# Patient Record
Sex: Female | Born: 1960 | Race: White | Hispanic: No | Marital: Married | Smoking: Former smoker
Health system: Southern US, Community
[De-identification: ages and names within clinical notes are randomized; demographics above are authoritative.]

## PROBLEM LIST (undated history)

## (undated) DIAGNOSIS — M8580 Other specified disorders of bone density and structure, unspecified site: Secondary | ICD-10-CM

## (undated) DIAGNOSIS — K589 Irritable bowel syndrome without diarrhea: Secondary | ICD-10-CM

## (undated) DIAGNOSIS — E042 Nontoxic multinodular goiter: Secondary | ICD-10-CM

## (undated) DIAGNOSIS — M1990 Unspecified osteoarthritis, unspecified site: Secondary | ICD-10-CM

## (undated) DIAGNOSIS — E785 Hyperlipidemia, unspecified: Secondary | ICD-10-CM

## (undated) HISTORY — DX: Nontoxic multinodular goiter: E04.2

## (undated) HISTORY — PX: CHOLECYSTECTOMY: SHX55

## (undated) HISTORY — DX: Hyperlipidemia, unspecified: E78.5

## (undated) HISTORY — DX: Unspecified osteoarthritis, unspecified site: M19.90

## (undated) HISTORY — DX: Other specified disorders of bone density and structure, unspecified site: M85.80

## (undated) HISTORY — DX: Irritable bowel syndrome, unspecified: K58.9

---

## 1998-03-24 ENCOUNTER — Other Ambulatory Visit: Admission: RE | Admit: 1998-03-24 | Discharge: 1998-03-24 | Payer: Self-pay | Admitting: Obstetrics and Gynecology

## 1998-12-11 ENCOUNTER — Ambulatory Visit (HOSPITAL_COMMUNITY): Admission: RE | Admit: 1998-12-11 | Discharge: 1998-12-11 | Payer: Self-pay | Admitting: Obstetrics and Gynecology

## 1999-08-10 ENCOUNTER — Other Ambulatory Visit: Admission: RE | Admit: 1999-08-10 | Discharge: 1999-08-10 | Payer: Self-pay | Admitting: Gynecology

## 1999-08-21 ENCOUNTER — Ambulatory Visit (HOSPITAL_COMMUNITY): Admission: RE | Admit: 1999-08-21 | Discharge: 1999-08-21 | Payer: Self-pay | Admitting: Gynecology

## 1999-08-21 ENCOUNTER — Encounter (INDEPENDENT_AMBULATORY_CARE_PROVIDER_SITE_OTHER): Payer: Self-pay | Admitting: Specialist

## 1999-12-14 ENCOUNTER — Ambulatory Visit (HOSPITAL_COMMUNITY): Admission: RE | Admit: 1999-12-14 | Discharge: 1999-12-14 | Payer: Self-pay | Admitting: Gynecology

## 1999-12-14 ENCOUNTER — Encounter: Payer: Self-pay | Admitting: Gynecology

## 2000-08-27 ENCOUNTER — Other Ambulatory Visit: Admission: RE | Admit: 2000-08-27 | Discharge: 2000-08-27 | Payer: Self-pay | Admitting: *Deleted

## 2001-01-21 ENCOUNTER — Encounter: Admission: RE | Admit: 2001-01-21 | Discharge: 2001-04-21 | Payer: Self-pay | Admitting: Gynecology

## 2001-03-22 ENCOUNTER — Inpatient Hospital Stay (HOSPITAL_COMMUNITY): Admission: AD | Admit: 2001-03-22 | Discharge: 2001-03-22 | Payer: Self-pay | Admitting: *Deleted

## 2001-03-25 ENCOUNTER — Inpatient Hospital Stay (HOSPITAL_COMMUNITY): Admission: AD | Admit: 2001-03-25 | Discharge: 2001-03-28 | Payer: Self-pay | Admitting: Gynecology

## 2001-03-25 ENCOUNTER — Encounter (INDEPENDENT_AMBULATORY_CARE_PROVIDER_SITE_OTHER): Payer: Self-pay | Admitting: Specialist

## 2001-03-30 ENCOUNTER — Encounter: Admission: RE | Admit: 2001-03-30 | Discharge: 2001-04-29 | Payer: Self-pay | Admitting: Gynecology

## 2001-05-06 ENCOUNTER — Other Ambulatory Visit: Admission: RE | Admit: 2001-05-06 | Discharge: 2001-05-06 | Payer: Self-pay | Admitting: *Deleted

## 2002-08-13 ENCOUNTER — Ambulatory Visit (HOSPITAL_COMMUNITY): Admission: RE | Admit: 2002-08-13 | Discharge: 2002-08-13 | Payer: Self-pay | Admitting: Gynecology

## 2002-08-13 ENCOUNTER — Other Ambulatory Visit: Admission: RE | Admit: 2002-08-13 | Discharge: 2002-08-13 | Payer: Self-pay | Admitting: Gynecology

## 2002-08-13 ENCOUNTER — Encounter: Payer: Self-pay | Admitting: Gynecology

## 2003-12-01 ENCOUNTER — Other Ambulatory Visit: Admission: RE | Admit: 2003-12-01 | Discharge: 2003-12-01 | Payer: Self-pay | Admitting: Gynecology

## 2003-12-02 ENCOUNTER — Ambulatory Visit (HOSPITAL_COMMUNITY): Admission: RE | Admit: 2003-12-02 | Discharge: 2003-12-02 | Payer: Self-pay | Admitting: Gynecology

## 2004-06-05 ENCOUNTER — Other Ambulatory Visit: Admission: RE | Admit: 2004-06-05 | Discharge: 2004-06-05 | Payer: Self-pay | Admitting: Gynecology

## 2004-09-17 ENCOUNTER — Ambulatory Visit: Payer: Self-pay | Admitting: Internal Medicine

## 2004-10-12 ENCOUNTER — Ambulatory Visit: Payer: Self-pay | Admitting: Internal Medicine

## 2004-10-19 ENCOUNTER — Ambulatory Visit: Payer: Self-pay | Admitting: Internal Medicine

## 2005-01-25 ENCOUNTER — Ambulatory Visit (HOSPITAL_COMMUNITY): Admission: RE | Admit: 2005-01-25 | Discharge: 2005-01-25 | Payer: Self-pay | Admitting: Gynecology

## 2005-06-26 ENCOUNTER — Other Ambulatory Visit: Admission: RE | Admit: 2005-06-26 | Discharge: 2005-06-26 | Payer: Self-pay | Admitting: Gynecology

## 2005-08-01 ENCOUNTER — Ambulatory Visit: Payer: Self-pay | Admitting: Internal Medicine

## 2005-08-27 ENCOUNTER — Ambulatory Visit: Payer: Self-pay | Admitting: Endocrinology

## 2005-09-10 ENCOUNTER — Ambulatory Visit: Payer: Self-pay | Admitting: Internal Medicine

## 2005-09-24 ENCOUNTER — Encounter: Admission: RE | Admit: 2005-09-24 | Discharge: 2005-09-24 | Payer: Self-pay | Admitting: Family Medicine

## 2006-03-18 ENCOUNTER — Ambulatory Visit (HOSPITAL_COMMUNITY): Admission: RE | Admit: 2006-03-18 | Discharge: 2006-03-18 | Payer: Self-pay | Admitting: Gynecology

## 2006-08-15 ENCOUNTER — Other Ambulatory Visit: Admission: RE | Admit: 2006-08-15 | Discharge: 2006-08-15 | Payer: Self-pay | Admitting: Gynecology

## 2007-09-25 ENCOUNTER — Other Ambulatory Visit: Admission: RE | Admit: 2007-09-25 | Discharge: 2007-09-25 | Payer: Self-pay | Admitting: Gynecology

## 2007-10-30 ENCOUNTER — Encounter: Admission: RE | Admit: 2007-10-30 | Discharge: 2007-10-30 | Payer: Self-pay | Admitting: Otolaryngology

## 2007-11-25 ENCOUNTER — Ambulatory Visit (HOSPITAL_COMMUNITY): Admission: RE | Admit: 2007-11-25 | Discharge: 2007-11-28 | Payer: Self-pay | Admitting: General Surgery

## 2007-11-25 ENCOUNTER — Encounter (INDEPENDENT_AMBULATORY_CARE_PROVIDER_SITE_OTHER): Payer: Self-pay | Admitting: General Surgery

## 2007-11-30 ENCOUNTER — Emergency Department (HOSPITAL_COMMUNITY): Admission: EM | Admit: 2007-11-30 | Discharge: 2007-12-01 | Payer: Self-pay | Admitting: Emergency Medicine

## 2007-12-02 ENCOUNTER — Inpatient Hospital Stay (HOSPITAL_COMMUNITY): Admission: EM | Admit: 2007-12-02 | Discharge: 2007-12-05 | Payer: Self-pay | Admitting: Emergency Medicine

## 2008-02-12 ENCOUNTER — Ambulatory Visit (HOSPITAL_COMMUNITY): Admission: RE | Admit: 2008-02-12 | Discharge: 2008-02-12 | Payer: Self-pay | Admitting: Gynecology

## 2008-12-23 ENCOUNTER — Other Ambulatory Visit: Admission: RE | Admit: 2008-12-23 | Discharge: 2008-12-23 | Payer: Self-pay | Admitting: Gynecology

## 2008-12-23 ENCOUNTER — Encounter: Payer: Self-pay | Admitting: Gynecology

## 2008-12-23 ENCOUNTER — Ambulatory Visit: Payer: Self-pay | Admitting: Gynecology

## 2009-01-26 IMAGING — CR DG ABDOMEN ACUTE W/ 1V CHEST
3 series · 3 of 3 positions shown · non-contrast
Comparison: Chest of 09/24/05.

CLINICAL DATA: Mid chest and back pain.  Cholecystectomy one week ago.
 ACUTE ABDOMINAL SERIES WITH CHEST ? 3 VIEW:

[w chest pa]
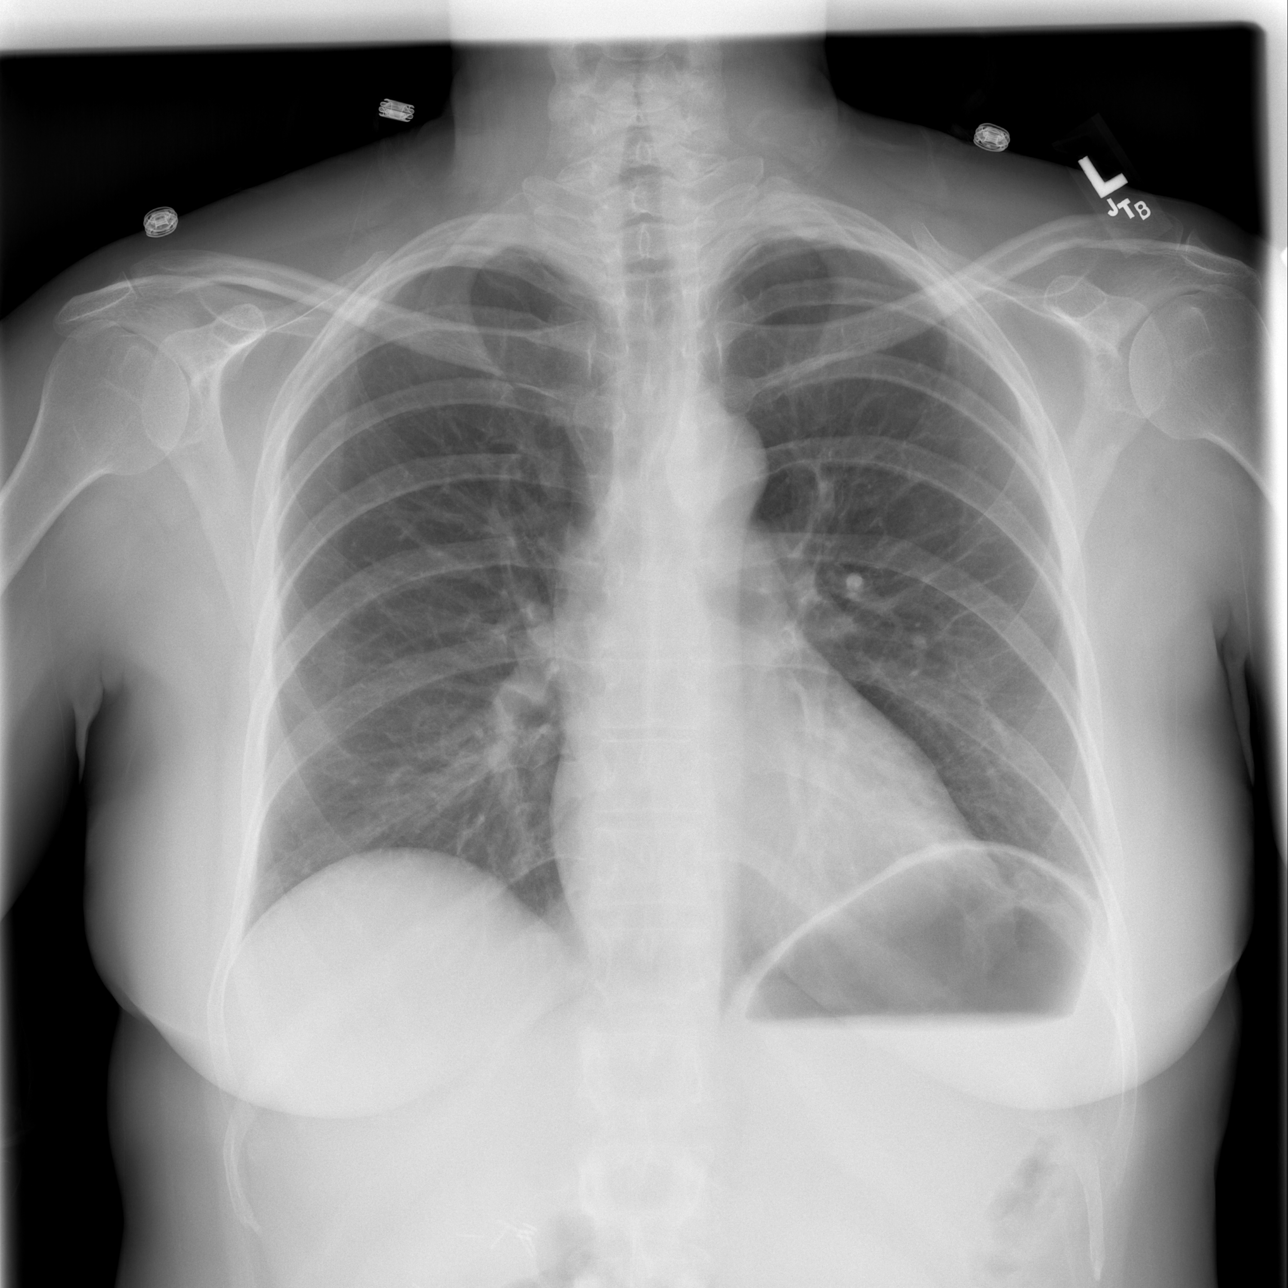

[w abdomen upright]
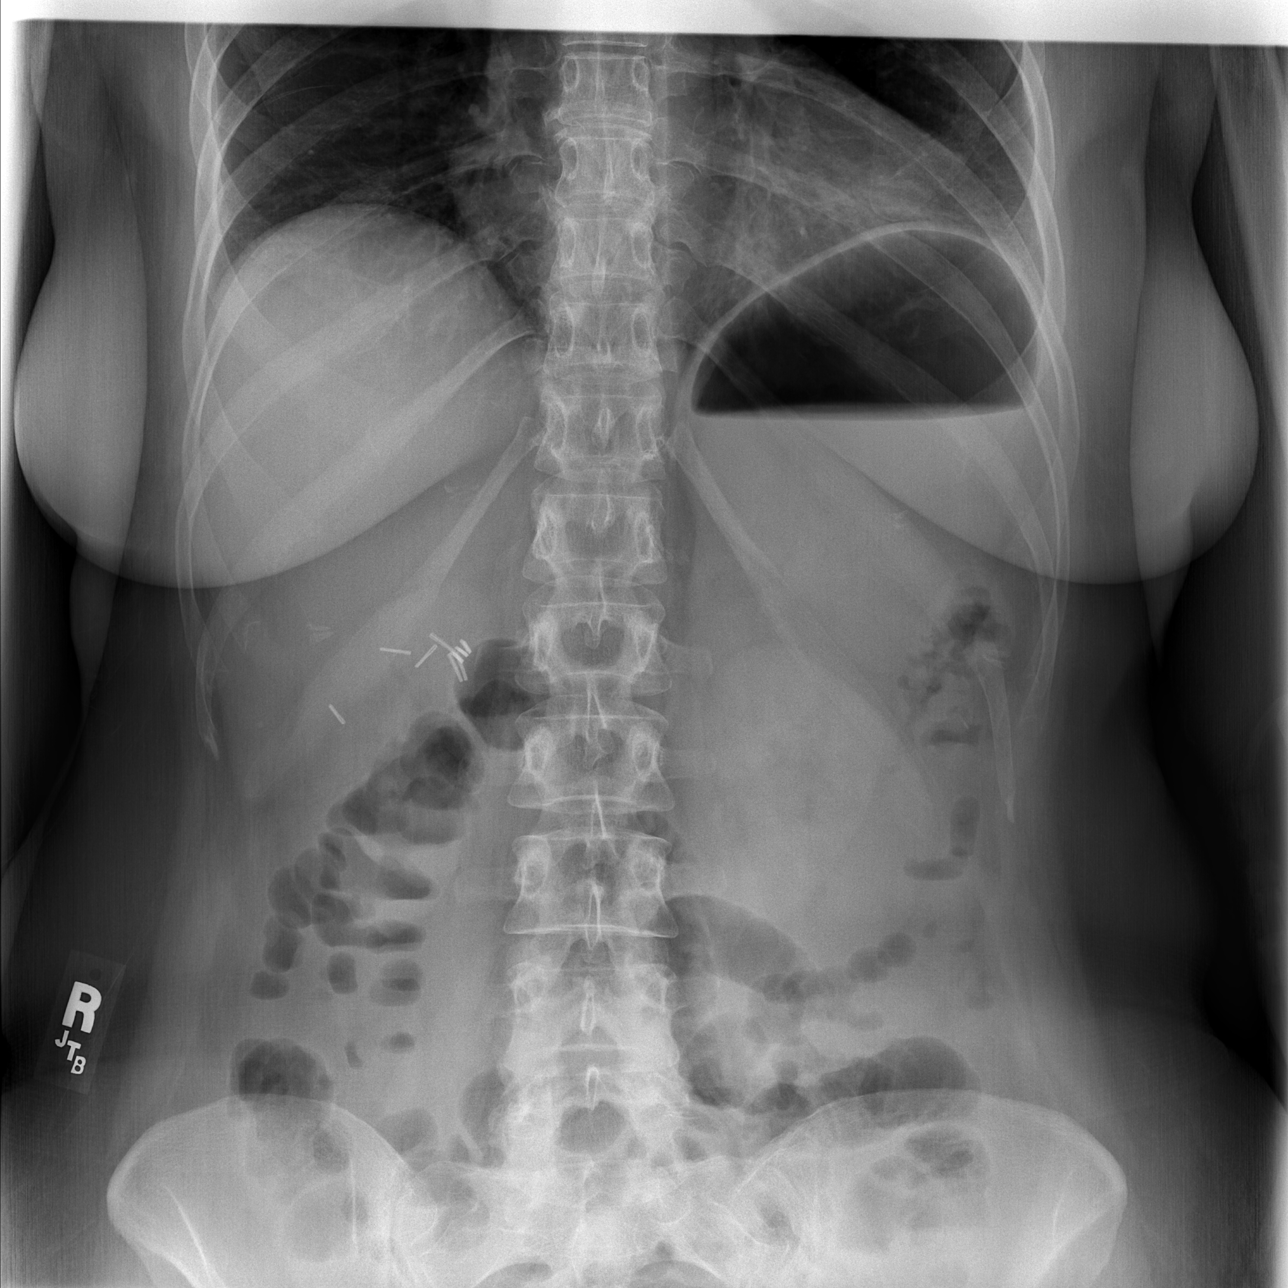

[t abdomen supine]
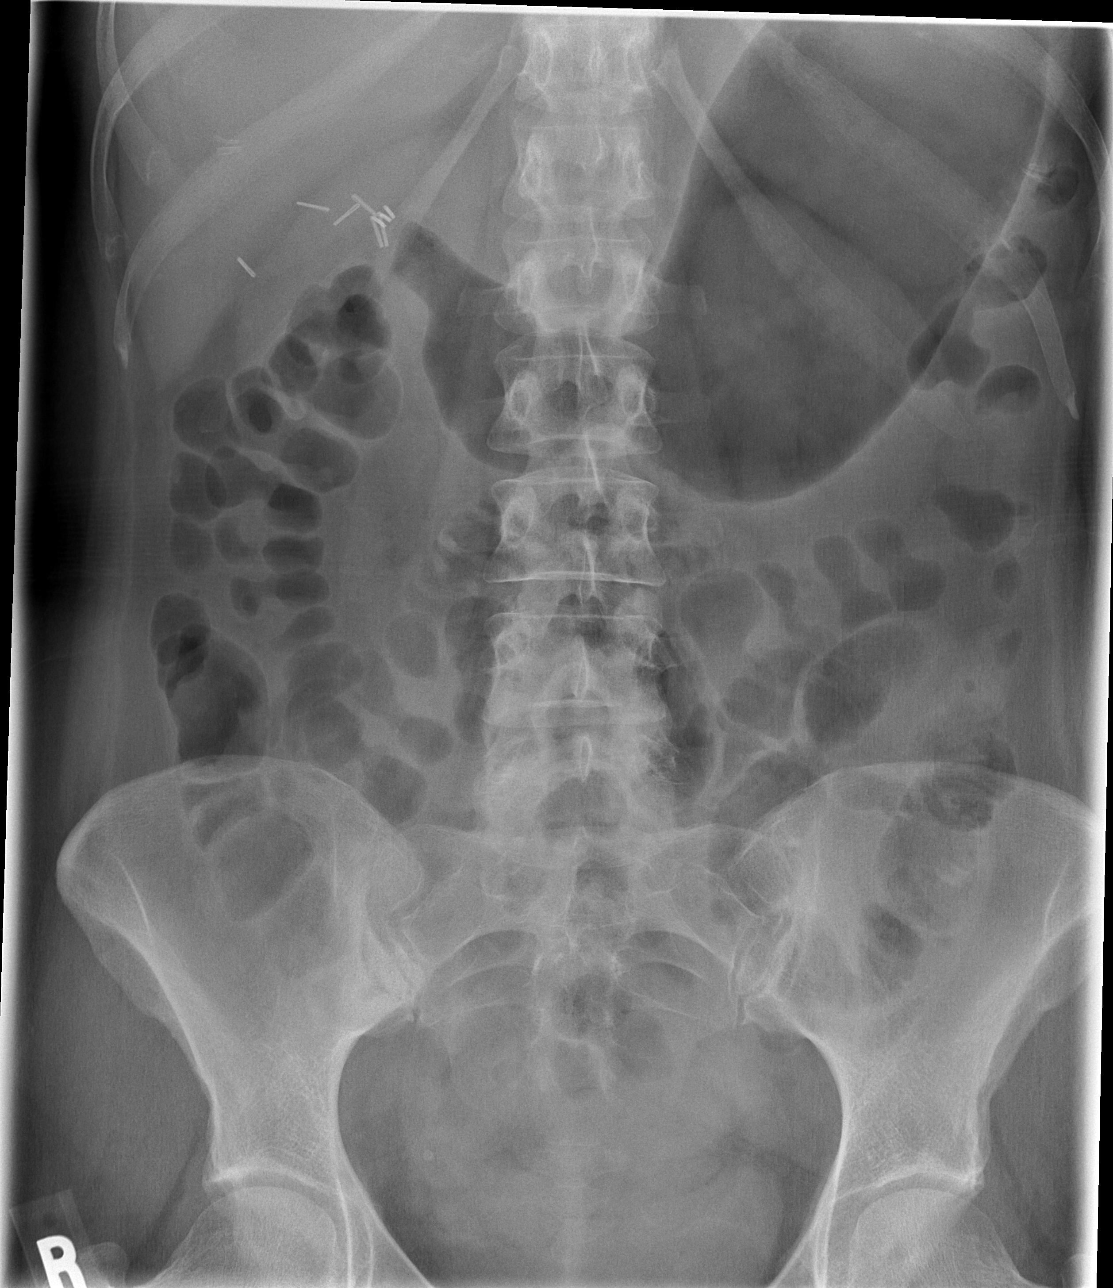

[3 of 3 positions shown; findings below may reference images not displayed]

FINDINGS: A single view of the chest shows the lungs to be clear.  The heart is within normal limits in size.  
 Supine and erect views of the abdomen show no bowel obstruction.  Slight gaseous distention of the small bowel is seen, but no obstruction is noted.  No free air is evident.  Surgical clips are present in the right upper quadrant.
IMPRESSION: 1. No active lung disease. 
 2. No obstruction or free air.  Minimal gaseous distention of a few loops of small bowel.  Consider followup if warranted clinically.

## 2009-01-27 ENCOUNTER — Ambulatory Visit: Payer: Self-pay | Admitting: Gynecology

## 2009-02-03 ENCOUNTER — Ambulatory Visit: Payer: Self-pay | Admitting: Gynecology

## 2009-03-03 ENCOUNTER — Ambulatory Visit (HOSPITAL_COMMUNITY): Admission: RE | Admit: 2009-03-03 | Discharge: 2009-03-03 | Payer: Self-pay | Admitting: Gynecology

## 2009-07-14 ENCOUNTER — Ambulatory Visit: Payer: Self-pay | Admitting: Gynecology

## 2009-07-28 ENCOUNTER — Ambulatory Visit: Payer: Self-pay | Admitting: Gynecology

## 2009-08-04 ENCOUNTER — Ambulatory Visit: Payer: Self-pay | Admitting: Gynecology

## 2010-05-18 ENCOUNTER — Encounter: Admission: RE | Admit: 2010-05-18 | Discharge: 2010-05-18 | Payer: Self-pay | Admitting: Family Medicine

## 2010-09-07 ENCOUNTER — Ambulatory Visit (HOSPITAL_COMMUNITY)
Admission: RE | Admit: 2010-09-07 | Discharge: 2010-09-07 | Payer: Self-pay | Source: Home / Self Care | Admitting: Gynecology

## 2010-11-16 ENCOUNTER — Other Ambulatory Visit: Payer: Self-pay | Admitting: Gynecology

## 2010-11-16 ENCOUNTER — Other Ambulatory Visit (HOSPITAL_COMMUNITY): Admission: RE | Admit: 2010-11-16 | Payer: BC Managed Care – HMO | Source: Ambulatory Visit | Admitting: Gynecology

## 2010-11-16 ENCOUNTER — Ambulatory Visit
Admission: RE | Admit: 2010-11-16 | Discharge: 2010-11-16 | Payer: Self-pay | Source: Home / Self Care | Attending: Gynecology | Admitting: Gynecology

## 2010-11-16 ENCOUNTER — Other Ambulatory Visit (HOSPITAL_COMMUNITY)
Admission: RE | Admit: 2010-11-16 | Discharge: 2010-11-16 | Disposition: A | Discharge status: Home / Self Care | Payer: BC Managed Care – HMO | Source: Ambulatory Visit | Attending: Gynecology | Admitting: Gynecology

## 2010-11-16 DIAGNOSIS — Z124 Encounter for screening for malignant neoplasm of cervix: Secondary | ICD-10-CM | POA: Insufficient documentation

## 2010-11-23 ENCOUNTER — Other Ambulatory Visit: Payer: BC Managed Care – HMO

## 2010-11-23 DIAGNOSIS — E7800 Pure hypercholesterolemia, unspecified: Secondary | ICD-10-CM

## 2010-11-26 ENCOUNTER — Other Ambulatory Visit: Payer: Self-pay

## 2010-12-14 ENCOUNTER — Ambulatory Visit (INDEPENDENT_AMBULATORY_CARE_PROVIDER_SITE_OTHER): Payer: BC Managed Care – HMO | Admitting: Gynecology

## 2010-12-14 ENCOUNTER — Other Ambulatory Visit: Payer: BC Managed Care – HMO

## 2010-12-14 DIAGNOSIS — N83339 Acquired atrophy of ovary and fallopian tube, unspecified side: Secondary | ICD-10-CM

## 2010-12-14 DIAGNOSIS — N949 Unspecified condition associated with female genital organs and menstrual cycle: Secondary | ICD-10-CM

## 2011-03-05 NOTE — Consult Note (Signed)
[name], [name]            ACCOUNT NO.:  [id number]   MEDICAL RECORD NO.:  [id number]          PATIENT TYPE:  OIB   LOCATION:  0098                         FACILITY:  [facility]   PHYSICIAN:  James L. Randa Evens, M.D. DATE OF BIRTH:  03-Dec-1960   DATE OF CONSULTATION:  DATE OF DISCHARGE:                                 CONSULTATION   We were asked to see [name] today in consultation for common bile  duct stone by Dr. Kendrick Ranch.   HISTORY OF PRESENT ILLNESS:  This is a very pleasant 50 year old female  with a very short past medical history, who saw her primary care  physician and had a renal ultrasound done recently as her father and  brother had both passed with renal cell carcinoma.  The ultrasound  revealed a stone-filled gallbladder and an appointment was scheduled  with Dr. Earlene Plater for a cholecystectomy.  Between the time that the  appointment was scheduled and the time that she was seen, the patient  became symptomatic with pretty severe chest pain between her breasts  that radiated to her back.  She claims it felt like a heart attack.  Her  cholecystectomy was done today.  She appears to have no complications.  However, on intraoperative cholangiogram there were common bile duct  stones found.  There was no obstruction.   PAST MEDICAL HISTORY:  Significant for a C-section in 2002 as well as a  thyroid goiter, but she has a normal-functioning thyroid.   FAMILY HISTORY:  Significant for renal cell carcinoma and her brother,  who has colon polyps.   SOCIAL HISTORY:  Negative for tobacco.  Negative for alcohol.  The  patient is married.  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   PHYSICAL EXAM:  She is currently in OR recovery waking up.  HEART:  Regular rate and rhythm with no murmurs, rubs or gallops  appreciated.  LUNGS:  Clear to auscultation bilaterally.  ABDOMEN:  Relatively nontender, however slightly tender to palpation in  the upper quadrants.  Her  bandage is clean and dry.  Her bowel sounds  are currently quiet, as is expected.   Labs done on January 30 showed a hemoglobin of 12.4, hematocrit 35.5.  BMET was within normal limits.  LFTs showed an AST of 23, ALT 16,  alkaline phosphatase 64, total bilirubin 0.9.  Her radiological exam  done immediately postop showed choledocholithiasis with several filling  defects in the CBD including a possible filling defect at the level of  the common hepatic duct.  She had no common bile duct obstruction.  There was no contrast extravasation and she had multiple gallstones  seen.   ASSESSMENT:  Dr. Carman Ching has seen and examined the patient.  His  impression is that this is a 50 year old female with  choledocholithiasis.   Plan for ERCP on February 5 at 1:15 with Dr. Wandalee Ferdinand.  The risks of  perforation, sedation, pancreatitis and possibly death were discussed  with both the patient and her husband, who understand these risks and  wish to proceed with the ERCP.  Thanks very much for this consultation.      Stephani Police, PA    ______________________________  Llana Aliment Randa Evens, M.D.    MLY/MEDQ  D:  11/25/2007  T:  11/26/2007  Job:  [id number]   cc:   Sheppard Plumber. Earlene Plater, M.D.  [address]  [place]  [place] [address]   Graylin Shiver, M.D.  Fax: [phone]   Jethro Bastos, M.D.  Fax: [phone]

## 2011-03-05 NOTE — Op Note (Signed)
[name], [name]            ACCOUNT NO.:  [id number]   MEDICAL RECORD NO.:  [id number]          PATIENT TYPE:  AMB   LOCATION:  DAY                          FACILITY:  [facility]   PHYSICIAN:  Timothy E. Earlene Plater, M.D. DATE OF BIRTH:  [birth year]   DATE OF PROCEDURE:  11/25/2007  DATE OF DISCHARGE:                               OPERATIVE REPORT   PREOPERATIVE DIAGNOSIS:  Cholecystolithiasis.   POSTOPERATIVE DIAGNOSIS:  Choledochocholecystolithiasis.   OPERATIVE PROCEDURE:  Laparoscopic cholecystectomy and operative  cholangiogram.   SURGEON:  Kendrick Ranch, M.D.   ASSISTANT:  Anselm Pancoast. Zachery Dakins, M.D.   ANESTHESIA:  General.   Ms. Terris has been seen in the office.  She was thought to have  asymptomatic gallstones until she had her first major obvious  gallbladder attack about two weeks ago.  Her discomfort has persisted,  and she was scheduled urgently.  Today, her laboratory data are normal.  Although she has had nothing but full liquids for the last 5 days.  She  was seen, identified, and the permit signed.   She was taken to operating room, placed supine.  General endotracheal  anesthesia administered.  The abdomen was prepped and draped in the  usual fashion.  Quarter percent Marcaine was used throughout for local  anesthesia.  A vertical infraumbilical incision made, the fascia  identified and opened, the peritoneum entered without complications.  The [facility] catheter placed, tied in place with a #1 Vicryl.  A second 10-  mm trocar was placed in the midepigastrium and two 5-mm trocars in the  right upper quadrant.  There were adhesions of the right colon to the  far lateral right abdominal wall, and there were dense and filmy  adhesions of the omentum to the gallbladder.  The gallbladder was  grasped, placed on tension, the adhesions taken down sharply and  bluntly, cautery used.  Once this was accomplished, irrigation was done  and was clear.  There was no evidence  bleeding.  The gallbladder was  displayed then in its length.  The base of the gallbladder was dissected  completely.  Cystic artery and the cystic duct were identified.  The  artery crossed over the duct, so I triply clipped and divided it.  The  clip was placed on the gallbladder side of the cystic duct.  The cystic  duct was then opened, and a percutaneously passed catheter was  introduced into the cystic duct stump.  A clip was applied, real-time  cholangiography carried out, and on three different runs, stones were  seen to float both in the common bile duct and the common hepatic duct.  So, these were not reachable via the small cystic duct, so the catheter  and clip were removed.  The cystic duct stump triply clipped and fully  divided.  The gallbladder was then dissected from the base of the liver  without complications.  The base was inspected.  It was dry.  All clips  were intact.  Gallbladder bladder was placed in an EndoCatch bag and  removed through the infraumbilical incision which was then closed and an  additional suture  placed.  This was all done under direct vision.  Further copious irrigation carried out.  Then, all irrigant, CO2,  instruments, and trocars removed under  direct vision, and all counts were correct.  Each wound was inspected  and closed with Monocryl.  Steri-Strips and dry sterile dressing  applied.  Final counts correct.  The patient was moved to recovery room  where she will admitted for extended stay, and gastroenterology will be  consulted.      Timothy E. Earlene Plater, M.D.  Electronically Signed     TED/MEDQ  D:  11/25/2007  T:  11/26/2007  Job:  [id number]   cc:   Jethro Bastos, M.D.  Fax: [phone]

## 2011-03-05 NOTE — Op Note (Signed)
[name], [name]            ACCOUNT NO.:  [id number]   MEDICAL RECORD NO.:  [id number]          PATIENT TYPE:  OIB   LOCATION:  1404                         FACILITY:  [facility]   PHYSICIAN:  John C. Madilyn Fireman, M.D.    DATE OF BIRTH:  06/21/1961   DATE OF PROCEDURE:  11/26/2007  DATE OF DISCHARGE:                               OPERATIVE REPORT   PROCEDURE:  Endoscopic retrograde cholangiopancreatography.   INDICATIONS FOR PROCEDURE:  Possible common bile duct stone versus air  bubbles seen on intraoperative cholangiogram at the time of ERCP  yesterday.   PROCEDURE:  The patient was placed in the prone position and placed on  the pulse monitor with continuous low-flow oxygen delivered by nasal  cannula.  She was sedated with 137.5 mcg IV fentanyl and 15 mg IV  Versed.  The Olympus side-viewing endoscope was advanced blindly into  the oropharynx, esophagus and stomach.  The pylorus was traversed and  the papilla of Vater located on the medial duodenal wall.  It had a  normal appearance but was somewhat protruding and was a little bit  difficult to approach head-on.  Approaches from an angle tended to push  the papillary structure to one side making cannulation difficult.  We  were able to obtain shallow cannulation and on one injection opacified  the pancreatic duct which appeared normal.  On repositioning, we were  able to opacify the common bile duct and with filling of intrahepatic  radicles and we did not see any filling defects.  However, I was not  able to obtain deep cannulation with either of size guidewires or with  the catheter.  During these attempts there was a fair amount of edema of  the papillary structure and we ceased to see the dye exiting it.  Also  we noted that the bile did not drain well from the CBD although it did  drain completely from the pancreatic duct.  With careful inspection not  revealing any intraluminal filling defects, I decided to forego further  attempts at deep cannulation at this point and the scope was then  withdrawn and the patient returned to the recovery room in stable  condition.  She tolerated the procedure well.  There were no immediate  complications.   IMPRESSION:  Normal-appearing common bile duct and pancreatic duct with  no visible intraluminal filling defects.  I suspect from review of  previous cholangiograms as well as nondilated duct and normal liver  function tests that defects seen yesterday may have been air bubbles  introduced during the procedure.   PLAN:  Expectant management alone for now.  Will monitor overnight for  possibility of procedure related pancreatitis and if does well  overnight, advance diet and expect early discharge.           ______________________________  Everardo All. Madilyn Fireman, M.D.     JCH/MEDQ  D:  11/26/2007  T:  11/27/2007  Job:  [id number]

## 2011-03-05 NOTE — H&P (Signed)
[name], [name] NO.:  [id number]   MEDICAL RECORD NO.:  [id number]          PATIENT TYPE:  INP   LOCATION:  5127                         FACILITY:  [facility]   PHYSICIAN:  John C. Madilyn Barnes, M.D.    DATE OF BIRTH:  Jul 31, 1961   DATE OF ADMISSION:  12/02/2007  DATE OF DISCHARGE:                              HISTORY & PHYSICAL   CHIEF COMPLAINT:  Abdominal and chest pain with vomiting.   HISTORY OF PRESENT ILLNESS:  The patient is a 50 year old white female  who underwent laparoscopic cholecystectomy on November 25, 2007 by Dr.  Lorelee New.  She had recently been diagnosed as having gallstones  during a renal ultrasound and then had an attack of abdominal pain,  nausea and vomiting consistent with biliary colic.  An intraoperative  cholangiogram was obtained and was felt to show either small stones or  air bubbles.  Her liver function tests were normal and her common bile  duct was normal in caliber.  However due to the filling defects, an ERCP  was done to assess for stones and no stones were seen.  However, there  was some edema to the patella related to the procedure and the bile duct  could not drain well.  Also I was unable to achieve free decannulation  of the common bile duct.  The following day,  there was a rise in her  liver function tests with transaminases in the 200 range.  She was doing  fairly well at that time, but it was decided to keep her in the hospital  another night.  Her liver function tests decreased slightly and she went  home on November 28, 2007.  Later that evening, she was experiencing some  mild epigastric chest discomfort and general malaise.  Since then, she  has had variable degrees of malaise, achiness, headaches and more  recently some resumption of some chest and back pain.  She went to the  emergency room on September 29, 2007 and her liver function tests were  nearly down to normal.  Amylase and lipase continued to be normal.   She  had a normal white blood cell count at that time.  She returned to see  Dr. Dorothe Pea yesterday and reportedly had some blood work that was  unremarkable according to the patient.  This morning, her husband called  stating she had been having increasing chest and back pain last night  and then was having nausea and vomiting this morning.  I had her come to  the emergency room.  Her  AST was 102 and ALT was 136, up somewhat from  values of 33 and 121 two days ago. She had a HIDA scan which was  reportedly negative for bile leak.  She is still having chest and  abdominal pain, nausea and she is admitted for further workup.   PAST MEDICAL HISTORY:  Thyroid goiter.   SURGERIES:  D&C.   ALLERGIES:  NONE.   MEDICATIONS:  None.   SOCIAL HISTORY:  The patient is married.  She has 1 daughter who works  as a Sales executive.  FAMILY HISTORY:  Positive for renal cell carcinoma and also colon polyps  in a brother.   PHYSICAL EXAMINATION:  GENERAL:  Well-developed, well-nourished white  female in no acute stress.  HEART:  Regular rhythm without murmur.  LUNGS:  Clear.  ABDOMEN:  Soft, nondistended with normoactive bowel sounds.  No  hepatosplenomegaly, mass or guarding.  There is some mild epigastric  tenderness.   LABORATORY DATA:  CBC 8400, hemoglobin 13.6, hematocrit 39.3, amylase  and lipase normal.  Bilirubin 0.5, alkaline phosphatase 103, AST 102,  ALT 136.   IMPRESSION:  1. Post-cholecystectomy and post-endoscopic retrograde      cholangiopancreatography.  2. Chest pain with some back pain.  3. Nausea and vomiting   PLAN:  We will admit for symptom control, recheck liver function tests  and begin workup with MR cholangiogram to rule out any possible retained  common duct stone.           ______________________________  [name], M.D.     JCH/MEDQ  D:  12/02/2007  T:  12/03/2007  Job:  [id number]   cc:   Sheppard Plumber. Earlene Plater, M.D.  Jethro Bastos, M.D.

## 2011-03-05 NOTE — Consult Note (Signed)
[name], [name]            ACCOUNT NO.:  [id number]   MEDICAL RECORD NO.:  [id number]          PATIENT TYPE:  INP   LOCATION:  5127                         FACILITY:  [facility]   PHYSICIAN:  Casimiro Needle L. Reynolds, M.D.DATE OF BIRTH:  Jan 23, 1961   DATE OF CONSULTATION:  12/03/2007  DATE OF DISCHARGE:                                 CONSULTATION   REASON FOR EVALUATION:  Headaches.   HISTORY OF PRESENT ILLNESS:  This is the initial inpatient consultation  evaluation of this 50 year old woman with little past medical history,  who was in the hospital about 10 days ago for a laparoscopic  cholecystectomy followed by ERCP for retained bile duct stone.  She was  discharged on November 27, 2007, but later developed generalized body  aches, abdominal pain, nausea and some headaches.  She was evaluated in  the ED on December 01, 2007, and also seen by her PCP who thought it was  a viral illness and sent her home.  She then developed vomiting and was  admitted yesterday for presumed pancreatitis.  However, her pancreatic  enzymes have been negative.  She complains of ongoing mild posterior  headache, but her major complaint is body aches.  She denies any  photophobia, any true stiffness of the neck, any focal neurologic  symptoms.  Neurologic consultation is requested.  Apparently there is  some concern that she might have meningitis.   PAST MEDICAL HISTORY:  She has no chronic medical problems.  She has had  a C-section in the past.   FAMILY AND SOCIAL HISTORY AND REVIEW OF SYSTEMS:  As per admission H and  P of December 02, 2007, which is reviewed.  Also see the accompanying  resident note.   MEDICATIONS PRIOR TO ADMISSION:  She is taking Vicodin p.r.n. which she  has continued here in the hospital.  She is also receiving Protonix.   PHYSICAL EXAMINATION:  VITAL SIGNS:  Temperature 98.5, blood pressure  103/71, pulse 67, respirations 20, O2 sat 96% on room air.  GENERAL:  This is a  healthy-appearing woman seen in no distress.  HEENT:  Head is normocephalic, atraumatic.  Oropharynx benign.  NECK:  Supple without carotid or supraclavicular bruits.  There is no  meningismus.  HEART:  Regular rate and rhythm without murmurs.  LUNGS:  Clear to auscultation.  MENTAL STATUS:  She is awake, alert.  She is fully oriented to time,  place and person.  Recent and remote memory are intact.  Attention span,  concentration, fund of knowledge are all appropriate.  Speech is fluent  and not dysarthric.  CRANIAL NERVES:  Pupils are equal and reactive.  Extraocular movements  are full without nystagmus.  She has no photophobia.  Visual fields full  to confrontation.  Hearing is intact.  Conversational speech.  Face,  tongue and palate move normally and symmetrically.  Motor, normal bulk  and tone.  Normal strength of extremity muscles.  Sensation intact to  light touch in all extremities.  Coordination, finger-to-nose performed  accurately.  Gait is unremarkable.  Reflexes 2+ and symmetric.  Toes are  downgoing bilaterally.  LABORATORY REVIEW:  Labs per daily notes.  Recent white count is 3.4.  Recent AST and ALT were 60 and 99 respectively, down from 102 and 136.   IMPRESSION:  Nonspecific headache.  She actually has a greater complaint  of body aches.  Suspect this is due to viral syndrome versus ongoing and  likely resolving effect of the recent manipulations that she has been  through.  She does not have meningitis.   RECOMMENDATIONS:  Treat symptomatically with Vicodin.  I would avoid  Fioricet as it tends to cause rebound headaches.  Heat to the neck will  also help.  No further testing is indicated.  Thank you for the  consultation.      Michael L. Thad Ranger, M.D.  Electronically Signed     MLR/MEDQ  D:  12/03/2007  T:  12/05/2007  Job:  [id number]

## 2011-03-08 NOTE — Op Note (Signed)
[facility]  Patient:    [name], [name]                   MRN: [id number] Proc. Date: 03/25/01 Adm. Date:  [id number] Attending:  Tonye Royalty                           Operative Report  PREOPERATIVE DIAGNOSIS:       1. Progress of dilation.  POSTOPERATIVE DIAGNOSES:      1. Progress of dilation.                               2.Macrosomia.  OPERATION:                    Primary cesarean section, low flap transverse via Pfannenstiel.  SURGEON:                      Douglass Rivers, M.D.  ASSISTANT:                    ______  ANESTHESIA:                   Epidural.  ESTIMATED BLOOD LOSS:         600.  FINDINGS:                     Viable female infant in vertex presentation, think meconium fluid, DeLee suction to 4 cc.  Birth weight was 9 pounds 11 ounces, Apgars 9/9.  Normal uterus, tubes, and ovaries.  COMPLICATIONS:                None.  PATHOLOGY:                    Placenta.  DESCRIPTION OF PROCEDURE:      The patient was taken to the operating room and placed in the supine position with left lateral displacement, prepped and draped in usual sterile fashion.  After adequate anesthesia, Pfannenstiel skin incision was made with the scalpel and carried through layer of fascia with electrocautery.  The fascia was scored in the midline and incision was extended laterally with the cautery.  Fascial incision grasped with Kochers, underlying rectus muscles dissected off using blunt and sharp dissection.  In a similar fashion, superior aspect of incision was grasped with Kochers, underlying rectus muscles dissected off.  The rectus muscle separated in the midline.  The peritoneum identified and entered by sharp dissection.  The peritoneal incision was extended superiorly to inferiorly with good visualization underlying bowel and bladder.  Orientation of the uterus was confirmed.  Bladder blade was inserted.  The vesicouterine  peritoneum was tented up and entered sharply with the Metzenbaum scissors laterally.  Bladder flap was created digitally.  The bladder blade was then  reinserted in the lower uterine segment incised in transverse fashion with scalpel. The infant was delivered from the vertex presentation with the aid of vacuum. Cord was cut and clamped.  Prior to being handed off to waiting pediatricians, the baby was DeLee suctioned for 4 cc of thin meconium solution.  The infant was handed off to the pediatrician.  Cord bloods were obtained.  The uterus was massaged, and the placenta was allowed to separate naturally.  The uterus was cleared of all clots and debris.  The uterus  was then repaired with a running locked layer of 0 chromic.   The gutters were cleared of all clots and debris.  The adnexa were inspected and noted to be normal.  Reinspection of the incision ensured hemostasis.  Areas of bleeding on the peritoneum, bladder flap, muscles, and fascia were treated with electrocautery.  The pelvis was irrigated with copious amounts of warm saline.  The fascia was closed with 0 Vicryl.  The subcutaneous tissue was irrigated.  Skin was closed with staples.  The patient tolerated the procedure well.  Sponge counts were correct x 2. She was transferred to the PACU in stable condition.  She received 1 g of Ancef intraoperatively. DD:  03/25/01 TD:  03/26/01 Job: [id number]

## 2011-03-08 NOTE — Discharge Summary (Signed)
NAMEJULIETH, [name] NO.:  [id number]   MEDICAL RECORD NO.:  [id number]          PATIENT TYPE:  INP   LOCATION:  5127                         FACILITY:  [facility]   PHYSICIAN:  John C. Madilyn Fireman, M.D.    DATE OF BIRTH:  12-04-1960   DATE OF ADMISSION:  12/02/2007  DATE OF DISCHARGE:  12/05/2007                               DISCHARGE SUMMARY   HISTORY OF PRESENT ILLNESS:  The patient is a 50 year old white female  who had undergone laparoscopic cholecystectomy on November 25, 2007, and  had intraoperative cholangiogram, which was felt to show either small  stones or air bubbles.  She underwent a postoperative ERCP, which was  normal, and no sphincterotomy was performed, but it was noticed that the  bile duct did not drain well, and I could not achieve free cannulation  of the bile duct.  Since then, she had variable degrees of abdominal  pain. Chest discomfort, general malaise, and variable elevation to the  liver function tests.  She had a HIDA scan which was negative for bile  leak, but at one point, she reported fever and on the morning of  admission, had some vomiting, so we decided to admit her for further  evaluation.  For details, please see admission history and physical.   COURSE IN HOSPITAL:  The patient underwent MRCP, which did not show any  common bile duct stone.  She complained of a lot of malaise and  headaches during the admission and her GI symptoms were vague, and I  checked a monospot to assess for mononucleosis, and this was negative,  also the possibility of an aseptic meningitis was raised due to the  headaches and general constitutional symptoms.  Neurology was consulted  and they did not feel like her symptoms warranted an LP or other workup  for spinal meningitis.  She continued to have rather vague complaints of  headache, abdominal pain, nausea, and general malaise, and it was  unclear to me whether there is significant objective evidence of  any  remaining biliary pathology.  Finally, we decided to obtain an abdominal  CT scan which was also negative.  On December 05, 2007, she was feeling  better overall, and it was felt she had reached maximum benefit of  hospitalization.  Her ALT was still 68 with the remaining liver function  tests normal. She had a slightly decreased white blood cell count with  normal differential on December 04, 2007, with a WBC count of 3.4, that  had risen to 4.8 on December 05, 2007.  She was felt ready to go home at  that time and was tolerating diet reasonably well.   DISCHARGE DIAGNOSES:  1. Possible post endoscopic retrograde      cholangiopancreatographypapillary edema causing biliary symptoms      and mild elevation of transaminases.  2. Recent cholecystectomy.   DISCHARGE MEDICATIONS:  1. Phenergan p.r.n. for nausea.  2. Vicodin p.r.n. for pain.   FOLLOWUP:  Followup appointment with Dr. Madilyn Fireman in 2-3 weeks and with  [facility] as scheduled.   CONDITION ON DISCHARGE:  Improved.  ______________________________  Everardo All Madilyn Fireman, M.D.     JCH/MEDQ  D:  01/10/2008  T:  01/11/2008  Job:  [id number]   cc:   Sheppard Plumber. Earlene Plater, M.D.  Jethro Bastos, M.D.

## 2011-03-08 NOTE — Discharge Summary (Signed)
[facility]  Patient:    [name], [name]                   MRN: [id number] Adm. Date:  [id number] Disc. Date: [id number] Attending:  Tonye Royalty Dictator:   Antony Contras, [facility]                           Discharge Summary  DISCHARGE DIAGNOSES:          1. Arrest of dilatation.                               2. Macrosomia.                               3. Intrauterine pregnancy at term.  PROCEDURE:                    Low transverse cesarean section with delivery of viable infant.  HISTORY OF PRESENT ILLNESS:   The patient is a 50 year old, gravida 3, para 0-0-2-0, LMP June 16, 2000, [facility] March 24, 2001.  Prenatal risk factors includeadvanced maternal age, the patient declined all prenatal testing. Shortened cervix.  PRENATAL LABORATORY DATA:     Blood type O positive, antibody screen negative. RPR, HBSAG, and HIV nonreactive.  Rubella immune.  GBS is negative.  HOSPITAL COURSE:              The patient was admitted on March 25, 2001, with a history of intermittent regular contractions over the last several days. She was 2 cm dilated, completely effaced.  Therefore she was admitted for induction of labor.  Artificial rupture of membranes revealed some meconium stained fluid.  The patient progressed very slowly and unable to establish a good contraction pattern, it was decided to proceed with cesarean section. Low transverse cesarean section was performed by Dr. Farrel Gobble under epidural anesthesia.  The patient was delivered of a viable female infant, Apgars 9 and 9, weight 9 pounds 11 ounces, normal uterus, tubes, and ovaries. Postoperatively, she remained afebrile, had no difficulty voiding, and was able to be discharged on her third postoperative day in satisfactory condition.  CBC; hematocrit 28.7, hemoglobin 9.9, WBC 16.6, platelets 221.  DISPOSITION:                  Follow up in six weeks.  Continue prenatal vitamins and iron.   Motrin and Tylox for pain. DD:  04/24/01 TD:  04/24/01 Job: [id number]

## 2011-03-08 NOTE — H&P (Signed)
[facility]  Patient:    [name], [name]                     MRN: [id number] Adm. Date:  03/25/01 Attending:  Marcial Pacas P. Fontaine, M.D.                         History and Physical  CHIEF COMPLAINT:              Prodromal labor, pregnancy at term.  HISTORY OF PRESENT ILLNESS:  [age]-year-old G3, P0, Ab2 female with history of intermittent regular contractions over the last several days which have led to several checks without evidence of overt labor.  She was evaluated in the office, found to be 2-cm dilated, completely effaced, 0 station, with a reactive NST, without regular contractions.  She did have some bloody show. The patient was sent home with precautions and is scheduled for induction if she is not in overt labor before.  Prenatal course has been uncomplicated. The patient has declined any prenatal testing for chromosomal or genetic abnormalities.  PAST MEDICAL HISTORY:         Uncomplicated.  PAST SURGICAL HISTORY:        SAB x 2 with D&Cs.  ALLERGIES:                    None.  MEDICATIONS:                  Vitamins.  REVIEW OF SYSTEMS:            Noncontributory.  PHYSICAL EXAMINATION:  VITAL SIGNS:                  Afebrile.  Vital signs are stable.  HEENT:                        Normal.  LUNGS:                        Clear.  CARDIAC:                      Regular rate, without rubs, murmurs or gallops.  ABDOMEN:                      Gravid, vertex fetus, reactive nonstress test, without overt contractions.  PELVIC:                       Completely effaced, 2 cm, 0 station.  ASSESSMENT:                   [age]-year-old gravida 3, para 0, abortus 2 female with prodromal labor, unable to sleep with intermittent bouts of regular contractions.  Patient has had several visits to the emergency room and office for evaluation and she is admitted at this time for induction if she does not undergo spontaneous labor  before.  Situation was discussed with the patient and her husband, the plan reviewed and she understands and accepts this.  Patient is beta streptococcus negative. DD:  03/25/01 TD:  03/25/01 Job: [id number]

## 2011-07-11 LAB — COMPREHENSIVE METABOLIC PANEL
Creatinine, Ser: 0.64
GFR calc Af Amer: >60
Potassium: 4.7
Total Bilirubin: 0.9
Total Protein: 6.8

## 2011-07-12 LAB — COMPREHENSIVE METABOLIC PANEL
ALT: 121 — ABNORMAL HIGH
ALT: 136 — ABNORMAL HIGH
ALT: 228 — ABNORMAL HIGH
ALT: 33
ALT: 99 — ABNORMAL HIGH
AST: 102 — ABNORMAL HIGH
AST: 292 — ABNORMAL HIGH
AST: 33
Albumin: 2.9 — ABNORMAL LOW
Albumin: 3.1 — ABNORMAL LOW
Albumin: 3.5
Albumin: 3.5
Albumin: 2.9 — ABNORMAL LOW
Alkaline Phosphatase: 121 — ABNORMAL HIGH
Alkaline Phosphatase: 136 — ABNORMAL HIGH
Alkaline Phosphatase: 88
Alkaline Phosphatase: 83
BUN: 11
BUN: 3 — ABNORMAL LOW
BUN: 4 — ABNORMAL LOW
BUN: 5 — ABNORMAL LOW
BUN: 5 — ABNORMAL LOW
CO2: 25
CO2: 26
CO2: 27
CO2: 29
Calcium: 8.8
Calcium: 8.9
Calcium: 9.6
Calcium: 9.8
Chloride: 101
Chloride: 104
Chloride: 104
Chloride: 106
Creatinine, Ser: 0.48
Creatinine, Ser: 0.49
GFR calc Af Amer: >60
GFR calc Af Amer: >60
GFR calc Af Amer: >60
GFR calc Af Amer: >60
GFR calc non Af Amer: >60
GFR calc non Af Amer: >60
GFR calc non Af Amer: >60
Glucose, Bld: 108 — ABNORMAL HIGH
Glucose, Bld: 113 — ABNORMAL HIGH
Glucose, Bld: 134 — ABNORMAL HIGH
Glucose, Bld: 93
Potassium: 3.3 — ABNORMAL LOW
Potassium: 3.8
Potassium: 3.8
Sodium: 134 — ABNORMAL LOW
Sodium: 135
Sodium: 135
Sodium: 139
Sodium: 139
Total Bilirubin: 0.4
Total Bilirubin: 0.4
Total Bilirubin: 0.5
Total Bilirubin: 0.6
Total Bilirubin: 0.7
Total Bilirubin: 1
Total Bilirubin: 0.4
Total Protein: 5.6 — ABNORMAL LOW
Total Protein: 6.4
Total Protein: 6

## 2011-07-12 LAB — ABO/RH: ABO/RH(D): O POS

## 2011-07-12 LAB — CBC
HCT: 31.3 — ABNORMAL LOW
HCT: 31.6 — ABNORMAL LOW
HCT: 31.7 — ABNORMAL LOW
HCT: 35 — ABNORMAL LOW
Hemoglobin: 11 — ABNORMAL LOW
Hemoglobin: 11.3 — ABNORMAL LOW
Hemoglobin: 13
MCHC: 34.9
MCHC: 35
MCHC: 35.7
MCV: 91.1
MCV: 92.6
Platelets: 228
Platelets: 283
Platelets: 289
Platelets: 230
RBC: 3.43 — ABNORMAL LOW
RBC: 3.48 — ABNORMAL LOW
RBC: 4.02
RDW: 12.4
RDW: 12.5
RDW: 12.4
WBC: 6.8
WBC: 7.9
WBC: 8.4
WBC: 4.8

## 2011-07-12 LAB — DIFFERENTIAL
Basophils Absolute: 0
Basophils Absolute: 0.1
Basophils Relative: 1
Eosinophils Absolute: 0.3
Eosinophils Absolute: 0.4
Eosinophils Relative: 5
Lymphocytes Relative: 6 — ABNORMAL LOW
Lymphs Abs: 0.5 — ABNORMAL LOW
Lymphs Abs: 0.9
Monocytes Absolute: 0.5
Monocytes Relative: 6
Neutrophils Relative %: 75
Neutrophils Relative %: 83 — ABNORMAL HIGH

## 2011-07-12 LAB — PROTIME-INR: Prothrombin Time: 14.5

## 2011-07-12 LAB — LIPASE, BLOOD
Lipase: 16
Lipase: 16

## 2011-07-12 LAB — CK TOTAL AND CKMB (NOT AT ARMC): CK, MB: 0.3

## 2011-07-12 LAB — AMYLASE: Amylase: 39

## 2011-07-12 LAB — CROSSMATCH: Antibody Screen: NEGATIVE

## 2011-07-12 LAB — MONONUCLEOSIS SCREEN: Mono Screen: NEGATIVE

## 2011-12-20 ENCOUNTER — Other Ambulatory Visit: Payer: Self-pay | Admitting: Gynecology

## 2011-12-20 DIAGNOSIS — Z1231 Encounter for screening mammogram for malignant neoplasm of breast: Secondary | ICD-10-CM

## 2012-01-31 ENCOUNTER — Ambulatory Visit (HOSPITAL_COMMUNITY): Payer: BC Managed Care – HMO

## 2012-02-06 ENCOUNTER — Other Ambulatory Visit: Payer: Self-pay | Admitting: Otolaryngology

## 2012-02-06 DIAGNOSIS — J3489 Other specified disorders of nose and nasal sinuses: Secondary | ICD-10-CM

## 2012-02-07 ENCOUNTER — Ambulatory Visit
Admission: RE | Admit: 2012-02-07 | Discharge: 2012-02-07 | Disposition: A | Discharge status: Home / Self Care | Payer: BC Managed Care – PPO | Source: Ambulatory Visit | Attending: Otolaryngology | Admitting: Otolaryngology

## 2012-02-07 DIAGNOSIS — J3489 Other specified disorders of nose and nasal sinuses: Secondary | ICD-10-CM

## 2012-02-28 ENCOUNTER — Ambulatory Visit (HOSPITAL_COMMUNITY)
Admission: RE | Admit: 2012-02-28 | Discharge: 2012-02-28 | Disposition: A | Discharge status: Home / Self Care | Payer: BC Managed Care – PPO | Source: Ambulatory Visit | Attending: Gynecology | Admitting: Gynecology

## 2012-02-28 DIAGNOSIS — Z1231 Encounter for screening mammogram for malignant neoplasm of breast: Secondary | ICD-10-CM

## 2012-09-25 ENCOUNTER — Encounter: Payer: Self-pay | Admitting: Gynecology

## 2012-09-25 ENCOUNTER — Ambulatory Visit (INDEPENDENT_AMBULATORY_CARE_PROVIDER_SITE_OTHER): Payer: BC Managed Care – PPO | Admitting: Gynecology

## 2012-09-25 VITALS — BP 106/68 | Ht 66.5 in | Wt 203.0 lb

## 2012-09-25 DIAGNOSIS — Z01419 Encounter for gynecological examination (general) (routine) without abnormal findings: Secondary | ICD-10-CM

## 2012-09-25 NOTE — Progress Notes (Signed)
[name] 1961/03/29 [id number]        51 y.o.  G3P1 for annual exam.    Past medical history,surgical history, medications, allergies, family history and social history were all reviewed and documented in the EPIC chart. ROS:  Was performed and pertinent positives and negatives are included in the history.  Exam: [name] assistant Filed Vitals:   09/25/12 1109  BP: 106/68  Height: 5' 6.5" (1.689 m)  Weight: 203 lb (92.08 kg)   General appearance  Normal Skin grossly normal Head/Neck normal with no cervical or supraclavicular adenopathy thyroid normal Lungs  clear Cardiac RR, without RMG Abdominal  soft, nontender, without masses, organomegaly or hernia Breasts  examined lying and sitting without masses, retractions, discharge or axillary adenopathy. Pelvic  Ext/BUS/vagina  normal   Cervix  normal   Uterus  anteverted, normal size, shape and contour, midline and mobile nontender   Adnexa  Without masses or tenderness    Anus and perineum  normal   Rectovaginal  normal sphincter tone without palpated masses or tenderness.    Assessment/Plan:  51 y.o. G13P1 female for annual exam.   1. Postmenopausal. Patient remains amenorrheic without significant symptoms. Will continue to follow. Patient knows to report any bleeding. 2. Pap smear January 2012. No Pap smear done today. No history of abnormal Pap smears. Plan 3 year follow up Pap smear next year. 3. Bone density 2009 normal. Plan repeat at 5 year interval. Increased calcium vitamin D reviewed. 4. Colonoscopy. Patient has not scheduled yet and knows the need to do so and agrees to do so. 5. Mammography 02/2012. Continue with annual mammography. SBE monthly reviewed. 6. Health maintenance. No lab work done today as this is all done through her primary physician's office who she sees on a regular basis. Follow up one year, sooner as needed.    Katrina Lords MD, 11:56 AM 09/25/2012

## 2012-09-25 NOTE — Patient Instructions (Signed)
Follow up one year for annual exam 

## 2012-09-26 LAB — URINALYSIS W MICROSCOPIC + REFLEX CULTURE
Bilirubin Urine: NEGATIVE
Hgb urine dipstick: NEGATIVE
Leukocytes, UA: NEGATIVE
Protein, ur: NEGATIVE mg/dL
Urobilinogen, UA: 0.2 mg/dL (ref 0.0–1.0)

## 2012-10-02 ENCOUNTER — Encounter: Payer: Self-pay | Admitting: Gynecology

## 2012-10-08 ENCOUNTER — Encounter: Payer: Self-pay | Admitting: Gynecology

## 2012-11-27 ENCOUNTER — Encounter: Payer: Self-pay | Admitting: Internal Medicine

## 2013-01-01 ENCOUNTER — Ambulatory Visit (AMBULATORY_SURGERY_CENTER): Payer: BC Managed Care – PPO | Admitting: *Deleted

## 2013-01-01 ENCOUNTER — Encounter: Payer: Self-pay | Admitting: Internal Medicine

## 2013-01-01 VITALS — Ht 66.5 in | Wt 206.4 lb

## 2013-01-01 DIAGNOSIS — Z1211 Encounter for screening for malignant neoplasm of colon: Secondary | ICD-10-CM

## 2013-01-01 MED ORDER — MOVIPREP 100 G PO SOLR: ORAL | Status: DC

## 2013-01-08 ENCOUNTER — Telehealth: Payer: Self-pay | Admitting: Internal Medicine

## 2013-01-08 NOTE — Telephone Encounter (Signed)
Called pt regarding upcoming appt for Colon,  In formed pt that I spoke with insurance company.  Pt plan is grand fathered from original plan so pt doesn't have any screening benefits.  Pt said she was aware and would have to cancel her plan until she saves up to cover her deductible.

## 2013-01-15 ENCOUNTER — Encounter: Payer: BC Managed Care – PPO | Admitting: Internal Medicine

## 2013-06-07 ENCOUNTER — Other Ambulatory Visit: Payer: Self-pay | Admitting: Family Medicine

## 2013-06-07 DIAGNOSIS — E049 Nontoxic goiter, unspecified: Secondary | ICD-10-CM

## 2013-06-18 ENCOUNTER — Ambulatory Visit
Admission: RE | Admit: 2013-06-18 | Discharge: 2013-06-18 | Disposition: A | Discharge status: Home / Self Care | Payer: BC Managed Care – PPO | Source: Ambulatory Visit | Attending: Family Medicine | Admitting: Family Medicine

## 2013-06-18 DIAGNOSIS — E049 Nontoxic goiter, unspecified: Secondary | ICD-10-CM

## 2013-06-25 ENCOUNTER — Other Ambulatory Visit: Payer: Self-pay

## 2013-06-25 DIAGNOSIS — Z1231 Encounter for screening mammogram for malignant neoplasm of breast: Secondary | ICD-10-CM

## 2013-07-23 ENCOUNTER — Ambulatory Visit
Admission: RE | Admit: 2013-07-23 | Discharge: 2013-07-23 | Disposition: A | Discharge status: Home / Self Care | Payer: BC Managed Care – PPO | Source: Ambulatory Visit

## 2013-07-23 DIAGNOSIS — Z1231 Encounter for screening mammogram for malignant neoplasm of breast: Secondary | ICD-10-CM

## 2013-10-01 ENCOUNTER — Encounter: Payer: BC Managed Care – PPO | Admitting: Gynecology

## 2014-12-30 ENCOUNTER — Other Ambulatory Visit: Payer: Self-pay | Admitting: Gynecology

## 2014-12-30 DIAGNOSIS — Z1231 Encounter for screening mammogram for malignant neoplasm of breast: Secondary | ICD-10-CM

## 2015-01-06 ENCOUNTER — Ambulatory Visit (HOSPITAL_COMMUNITY)
Admission: RE | Admit: 2015-01-06 | Discharge: 2015-01-06 | Disposition: A | Discharge status: Home / Self Care | Payer: 59 | Source: Ambulatory Visit | Attending: Gynecology | Admitting: Gynecology

## 2015-01-06 DIAGNOSIS — Z1231 Encounter for screening mammogram for malignant neoplasm of breast: Secondary | ICD-10-CM | POA: Diagnosis present

## 2015-01-20 ENCOUNTER — Encounter: Payer: Self-pay | Admitting: Women's Health

## 2015-02-17 ENCOUNTER — Encounter: Payer: Self-pay | Admitting: Women's Health

## 2015-02-17 ENCOUNTER — Other Ambulatory Visit (HOSPITAL_COMMUNITY)
Admission: RE | Admit: 2015-02-17 | Discharge: 2015-02-17 | Disposition: A | Discharge status: Home / Self Care | Payer: 59 | Source: Ambulatory Visit | Attending: Women's Health | Admitting: Women's Health

## 2015-02-17 ENCOUNTER — Ambulatory Visit (INDEPENDENT_AMBULATORY_CARE_PROVIDER_SITE_OTHER): Payer: 59 | Admitting: Women's Health

## 2015-02-17 VITALS — BP 132/82 | Ht 66.0 in | Wt 213.0 lb

## 2015-02-17 DIAGNOSIS — Z1151 Encounter for screening for human papillomavirus (HPV): Secondary | ICD-10-CM | POA: Diagnosis present

## 2015-02-17 DIAGNOSIS — Z01419 Encounter for gynecological examination (general) (routine) without abnormal findings: Secondary | ICD-10-CM | POA: Diagnosis present

## 2015-02-17 DIAGNOSIS — Z13820 Encounter for screening for osteoporosis: Secondary | ICD-10-CM | POA: Diagnosis not present

## 2015-02-17 NOTE — Progress Notes (Signed)
Carloyn JaegerJacqueline J Tison May 28, 1961 [id number]    History:    Presents for annual exam.  Last office visit 2013. Amenorrheic greater than 7 years on no HRT with menopausal complaints. Normal mammogram and Pap history. 2010 benign endometrial biopsy. Primary care manages labs. Normal DEXA 2009.  Past medical history, past surgical history, family history and social history were all reviewed and documented in the EPIC chart. Armed forces operational officerDental hygienist, works part-time. Zollie ScaleOlivia 13 doing well. Mother heart disease. Father and brother kidney cancer.  ROS:  A ROS was performed and pertinent positives and negatives are included.  Exam:  Filed Vitals:   02/17/15 0925  BP: 132/82    General appearance:  Normal Thyroid:  Symmetrical, normal in size, without palpable masses or nodularity. Respiratory  Auscultation:  Clear without wheezing or rhonchi Cardiovascular  Auscultation:  Regular rate, without rubs, murmurs or gallops  Edema/varicosities:  Not grossly evident Abdominal  Soft,nontender, without masses, guarding or rebound.  Liver/spleen:  No organomegaly noted  Hernia:  None appreciated  Skin  Inspection:  Grossly normal   Breasts: Examined lying and sitting.     Right: Without masses, retractions, discharge or axillary adenopathy.     Left: Without masses, retractions, discharge or axillary adenopathy. Gentitourinary   Inguinal/mons:  Normal without inguinal adenopathy  External genitalia:  Normal  BUS/Urethra/Skene's glands:  Normal  Vagina:  Normal  Cervix:  Normal  Uterus:  normal in size, shape and contour.  Midline and mobile  Adnexa/parametria:     Rt: Without masses or tenderness.   Lt: Without masses or tenderness.  Anus and perineum: Normal  Digital rectal exam: Normal sphincter tone without palpated masses or tenderness  Assessment/Plan:  54 y.o.MWF G3P1  for annual exam with complaint of moodiness, weight gain, low libido, and vaginal dryness.  Postmenopausal  symptoms Obesity Labs primary care  Plan: Menopause reviewed, HRT, options reviewed, will try over-the-counter Replens, reviewed importance of increasing regular exercise, leisure activities, date night and making time for the relationship. Instructed to call if no relief denies need for counseling at this time. SBE's, continue annual screening mammograms, decreasing calories for weight loss, Weight Watchers reviewed, calcium rich diet and vitamin D 1000 daily encouraged. UA, Pap with HR HPV typing, new screening guidelines reviewed. Schedule DEXA.    Harrington ChallengerYOUNG,NANCY J WHNP, 11:22 AM 02/17/2015

## 2015-02-17 NOTE — Patient Instructions (Signed)
Health Recommendations for Postmenopausal Women Respected and ongoing research has looked at the most common causes of death, disability, and poor quality of life in postmenopausal women. The causes include heart disease, diseases of blood vessels, diabetes, depression, cancer, and bone loss (osteoporosis). Many things can be done to help lower the chances of developing these and other common problems. CARDIOVASCULAR DISEASE Heart Disease: A heart attack is a medical emergency. Know the signs and symptoms of a heart attack. Below are things women can do to reduce their risk for heart disease.   Do not smoke. If you smoke, quit.  Aim for a healthy weight. Being overweight causes many preventable deaths. Eat a healthy and balanced diet and drink an adequate amount of liquids.  Get moving. Make a commitment to be more physically active. Aim for 30 minutes of activity on most, if not all days of the week.  Eat for heart health. Choose a diet that is low in saturated fat and cholesterol and eliminate trans fat. Include whole grains, vegetables, and fruits. Read and understand the labels on food containers before buying.  Know your numbers. Ask your caregiver to check your blood pressure, cholesterol (total, HDL, LDL, triglycerides) and blood glucose. Work with your caregiver on improving your entire clinical picture.  High blood pressure. Limit or stop your table salt intake (try salt substitute and food seasonings). Avoid salty foods and drinks. Read labels on food containers before buying. Eating well and exercising can help control high blood pressure. STROKE  Stroke is a medical emergency. Stroke may be the result of a blood clot in a blood vessel in the brain or by a brain hemorrhage (bleeding). Know the signs and symptoms of a stroke. To lower the risk of developing a stroke:  Avoid fatty foods.  Quit smoking.  Control your diabetes, blood pressure, and irregular heart rate. THROMBOPHLEBITIS  (BLOOD CLOT) OF THE LEG  Becoming overweight and leading a stationary lifestyle may also contribute to developing blood clots. Controlling your diet and exercising will help lower the risk of developing blood clots. CANCER SCREENING  Breast Cancer: Take steps to reduce your risk of breast cancer.  You should practice "breast self-awareness." This means understanding the normal appearance and feel of your breasts and should include breast self-examination. Any changes detected, no matter how small, should be reported to your caregiver.  After age 4, you should have a clinical breast exam (CBE) every year.  Starting at age 48, you should consider having a mammogram (breast X-ray) every year.  If you have a family history of breast cancer, talk to your caregiver about genetic screening.  If you are at high risk for breast cancer, talk to your caregiver about having an MRI and a mammogram every year.  Intestinal or Stomach Cancer: Tests to consider are a rectal exam, fecal occult blood, sigmoidoscopy, and colonoscopy. Women who are high risk may need to be screened at an earlier age and more often.  Cervical Cancer:  Beginning at age 72, you should have a Pap test every 3 years as long as the past 3 Pap tests have been normal.  If you have had past treatment for cervical cancer or a condition that could lead to cancer, you need Pap tests and screening for cancer for at least 20 years after your treatment.  If you had a hysterectomy for a problem that was not cancer or a condition that could lead to cancer, then you no longer need Pap tests.  If you are between ages 65 and 70, and you have had normal Pap tests going back 10 years, you no longer need Pap tests.  If Pap tests have been discontinued, risk factors (such as a new sexual partner) need to be reassessed to determine if screening should be resumed.  Some medical problems can increase the chance of getting cervical cancer. In these  cases, your caregiver may recommend more frequent screening and Pap tests.  Uterine Cancer: If you have vaginal bleeding after reaching menopause, you should notify your caregiver.  Ovarian Cancer: Other than yearly pelvic exams, there are no reliable tests available to screen for ovarian cancer at this time except for yearly pelvic exams.  Lung Cancer: Yearly chest X-rays can detect lung cancer and should be done on high risk women, such as cigarette smokers and women with chronic lung disease (emphysema).  Skin Cancer: A complete body skin exam should be done at your yearly examination. Avoid overexposure to the sun and ultraviolet light lamps. Use a strong sun block cream when in the sun. All of these things are important for lowering the risk of skin cancer. MENOPAUSE Menopause Symptoms: Hormone therapy products are effective for treating symptoms associated with menopause:  Moderate to severe hot flashes.  Night sweats.  Mood swings.  Headaches.  Tiredness.  Loss of sex drive.  Insomnia.  Other symptoms. Hormone replacement carries certain risks, especially in older women. Women who use or are thinking about using estrogen or estrogen with progestin treatments should discuss that with their caregiver. Your caregiver will help you understand the benefits and risks. The ideal dose of hormone replacement therapy is not known. The Food and Drug Administration (FDA) has concluded that hormone therapy should be used only at the lowest doses and for the shortest amount of time to reach treatment goals.  OSTEOPOROSIS Protecting Against Bone Loss and Preventing Fracture If you use hormone therapy for prevention of bone loss (osteoporosis), the risks for bone loss must outweigh the risk of the therapy. Ask your caregiver about other medications known to be safe and effective for preventing bone loss and fractures. To guard against bone loss or fractures, the following is recommended:  If  you are younger than age 50, take 1000 mg of calcium and at least 600 mg of Vitamin D per day.  If you are older than age 50 but younger than age 70, take 1200 mg of calcium and at least 600 mg of Vitamin D per day.  If you are older than age 70, take 1200 mg of calcium and at least 800 mg of Vitamin D per day. Smoking and excessive alcohol intake increases the risk of osteoporosis. Eat foods rich in calcium and vitamin D and do weight bearing exercises several times a week as your caregiver suggests. DIABETES Diabetes Mellitus: If you have type I or type 2 diabetes, you should keep your blood sugar under control with diet, exercise, and recommended medication. Avoid starchy and fatty foods, and too many sweets. Being overweight can make diabetes control more difficult. COGNITION AND MEMORY Cognition and Memory: Menopausal hormone therapy is not recommended for the prevention of cognitive disorders such as Alzheimer's disease or memory loss.  DEPRESSION  Depression may occur at any age, but it is common in elderly women. This may be because of physical, medical, social (loneliness), or financial problems and needs. If you are experiencing depression because of medical problems and control of symptoms, talk to your caregiver about this. Physical   activity and exercise may help with mood and sleep. Community and volunteer involvement may improve your sense of value and worth. If you have depression and you feel that the problem is getting worse or becoming severe, talk to your caregiver about which treatment options are best for you. ACCIDENTS  Accidents are common and can be serious in elderly woman. Prepare your house to prevent accidents. Eliminate throw rugs, place hand bars in bath, shower, and toilet areas. Avoid wearing high heeled shoes or walking on wet, snowy, and icy areas. Limit or stop driving if you have vision or hearing problems, or if you feel you are unsteady with your movements and  reflexes. HEPATITIS C Hepatitis C is a type of viral infection affecting the liver. It is spread mainly through contact with blood from an infected person. It can be treated, but if left untreated, it can lead to severe liver damage over the years. Many people who are infected do not know that the virus is in their blood. If you are a "baby-boomer", it is recommended that you have one screening test for Hepatitis C. IMMUNIZATIONS  Several immunizations are important to consider having during your senior years, including:   Tetanus, diphtheria, and pertussis booster shot.  Influenza every year before the flu season begins.  Pneumonia vaccine.  Shingles vaccine.  Others, as indicated based on your specific needs. Talk to your caregiver about these. Document Released: 11/29/2005 Document Revised: 02/21/2014 Document Reviewed: 07/25/2008 ExitCare Patient Information 2015 [employer]. This information is not intended to replace advice given to you by your health care provider. Make sure you discuss any questions you have with your health care provider. Exercise to Stay Healthy Exercise helps you become and stay healthy. EXERCISE IDEAS AND TIPS Choose exercises that:  You enjoy.  Fit into your day. You do not need to exercise really hard to be healthy. You can do exercises at a slow or medium level and stay healthy. You can:  Stretch before and after working out.  Try yoga, Pilates, or tai chi.  Lift weights.  Walk fast, swim, jog, run, climb stairs, bicycle, dance, or rollerskate.  Take aerobic classes. Exercises that burn about 150 calories:  Running 1  miles in 15 minutes.  Playing volleyball for 45 to 60 minutes.  Washing and waxing a car for 45 to 60 minutes.  Playing touch football for 45 minutes.  Walking 1  miles in 35 minutes.  Pushing a stroller 1  miles in 30 minutes.  Playing basketball for 30 minutes.  Raking leaves for 30 minutes.  Bicycling 5  miles in 30 minutes.  Walking 2 miles in 30 minutes.  Dancing for 30 minutes.  Shoveling snow for 15 minutes.  Swimming laps for 20 minutes.  Walking up stairs for 15 minutes.  Bicycling 4 miles in 15 minutes.  Gardening for 30 to 45 minutes.  Jumping rope for 15 minutes.  Washing windows or floors for 45 to 60 minutes. Document Released: 11/09/2010 Document Revised: 12/30/2011 Document Reviewed: 11/09/2010 Exit[employer] Patient Information 2015 [employer]. This information is not intended to replace advice given to you by your health care provider. Make sure you discuss any questions you have with your health care provider.  

## 2015-02-18 LAB — URINALYSIS W MICROSCOPIC + REFLEX CULTURE
BACTERIA UA: NONE SEEN
BILIRUBIN URINE: NEGATIVE
Casts: NONE SEEN
Crystals: NONE SEEN
GLUCOSE, UA: NEGATIVE mg/dL
HGB URINE DIPSTICK: NEGATIVE
KETONES UR: NEGATIVE mg/dL
Leukocytes, UA: NEGATIVE
Nitrite: NEGATIVE
PROTEIN: NEGATIVE mg/dL
SPECIFIC GRAVITY, URINE: 1.016 (ref 1.005–1.030)
SQUAMOUS EPITHELIAL / LPF: NONE SEEN
UROBILINOGEN UA: 0.2 mg/dL (ref 0.0–1.0)
pH: 6 (ref 5.0–8.0)

## 2015-02-20 LAB — CYTOLOGY - PAP

## 2015-04-12 ENCOUNTER — Other Ambulatory Visit: Payer: Self-pay | Admitting: Gynecology

## 2015-04-12 DIAGNOSIS — Z13820 Encounter for screening for osteoporosis: Secondary | ICD-10-CM

## 2015-04-21 DIAGNOSIS — M8580 Other specified disorders of bone density and structure, unspecified site: Secondary | ICD-10-CM

## 2015-04-21 HISTORY — DX: Other specified disorders of bone density and structure, unspecified site: M85.80

## 2015-05-01 ENCOUNTER — Encounter: Payer: Self-pay | Admitting: Gastroenterology

## 2015-05-01 ENCOUNTER — Ambulatory Visit (INDEPENDENT_AMBULATORY_CARE_PROVIDER_SITE_OTHER): Payer: 59

## 2015-05-01 ENCOUNTER — Other Ambulatory Visit: Payer: Self-pay | Admitting: Gynecology

## 2015-05-01 DIAGNOSIS — M8580 Other specified disorders of bone density and structure, unspecified site: Secondary | ICD-10-CM

## 2015-05-01 DIAGNOSIS — Z13820 Encounter for screening for osteoporosis: Secondary | ICD-10-CM | POA: Diagnosis not present

## 2015-05-02 ENCOUNTER — Encounter: Payer: Self-pay | Admitting: Gynecology

## 2015-05-19 ENCOUNTER — Encounter: Payer: Self-pay | Admitting: Internal Medicine

## 2015-07-14 ENCOUNTER — Encounter: Payer: 59 | Admitting: Gastroenterology

## 2015-08-04 ENCOUNTER — Ambulatory Visit (AMBULATORY_SURGERY_CENTER): Payer: Self-pay

## 2015-08-04 VITALS — Ht 66.0 in | Wt 210.2 lb

## 2015-08-04 DIAGNOSIS — Z800 Family history of malignant neoplasm of digestive organs: Secondary | ICD-10-CM

## 2015-08-04 MED ORDER — SUPREP BOWEL PREP KIT 17.5-3.13-1.6 GM/177ML PO SOLN: 1.0000 | Freq: Once | ORAL | Status: DC

## 2015-08-04 NOTE — Progress Notes (Signed)
No allergies to eggs or soy No past problems with anesthesia No diet/weight loss meds No home oxygen  Has email  Emmi instructions given for colonoscopy 

## 2015-08-18 ENCOUNTER — Ambulatory Visit (AMBULATORY_SURGERY_CENTER): Payer: 59 | Admitting: Internal Medicine

## 2015-08-18 ENCOUNTER — Encounter: Payer: Self-pay | Admitting: Internal Medicine

## 2015-08-18 VITALS — BP 148/82 | HR 73 | Temp 96.0°F | Resp 12 | Ht 66.0 in | Wt 210.0 lb

## 2015-08-18 DIAGNOSIS — Z1211 Encounter for screening for malignant neoplasm of colon: Secondary | ICD-10-CM

## 2015-08-18 MED ORDER — SODIUM CHLORIDE 0.9 % IV SOLN: 500.0000 mL | INTRAVENOUS | Status: DC

## 2015-08-18 NOTE — Progress Notes (Signed)
A/ox3 pleased with MAC, report to Penny RN 

## 2015-08-18 NOTE — Patient Instructions (Signed)
YOU HAD AN ENDOSCOPIC PROCEDURE TODAY AT THE [facility]:   Refer to the procedure report that was given to you for any specific questions about what was found during the examination.  If the procedure report does not answer your questions, please call your gastroenterologist to clarify.  If you requested that your care partner not be given the details of your procedure findings, then the procedure report has been included in a sealed envelope for you to review at your convenience later.  YOU SHOULD EXPECT: Some feelings of bloating in the abdomen. Passage of more gas than usual.  Walking can help get rid of the air that was put into your GI tract during the procedure and reduce the bloating. If you had a lower endoscopy (such as a colonoscopy or flexible sigmoidoscopy) you may notice spotting of blood in your stool or on the toilet paper. If you underwent a bowel prep for your procedure, you may not have a normal bowel movement for a few days.  Please Note:  You might notice some irritation and congestion in your nose or some drainage.  This is from the oxygen used during your procedure.  There is no need for concern and it should clear up in a day or so.  SYMPTOMS TO REPORT IMMEDIATELY:   Following lower endoscopy (colonoscopy or flexible sigmoidoscopy):  Excessive amounts of blood in the stool  Significant tenderness or worsening of abdominal pains  Swelling of the abdomen that is new, acute  Fever of 100F or higher   For urgent or emergent issues, a gastroenterologist can be reached at any hour by calling [phone].   DIET: Your first meal following the procedure should be a small meal and then it is ok to progress to your normal diet. Heavy or fried foods are harder to digest and may make you feel nauseous or bloated.  Likewise, meals heavy in dairy and vegetables can increase bloating.  Drink plenty of fluids but you should avoid alcoholic beverages for 24  hours.  ACTIVITY:  You should plan to take it easy for the rest of today and you should NOT DRIVE or use heavy machinery until tomorrow (because of the sedation medicines used during the test).    FOLLOW UP: Our staff will call the number listed on your records the next business day following your procedure to check on you and address any questions or concerns that you may have regarding the information given to you following your procedure. If we do not reach you, we will leave a message.  However, if you are feeling well and you are not experiencing any problems, there is no need to return our call.  We will assume that you have returned to your regular daily activities without incident.  If any biopsies were taken you will be contacted by phone or by letter within the next 1-3 weeks.  Please call us at [phone] if you have not heard about the biopsies in 3 weeks.    SIGNATURES/CONFIDENTIALITY: You and/or your care partner have signed paperwork which will be entered into your electronic medical record.  These signatures attest to the fact that that the information above on your After Visit Summary has been reviewed and is understood.  Full responsibility of the confidentiality of this discharge information lies with you and/or your care-partner. 

## 2015-08-18 NOTE — Op Note (Signed)
[facility] [address] [place], [id number]   COLONOSCOPY PROCEDURE REPORT  PATIENT: Katrina JaegerMoyer, [name]  MR#: [id number] BIRTHDATE: [birth year] , 54  yrs. old GENDER: female ENDOSCOPIST: Beverley FiedlerJay M Pyrtle, MD REFERRED QM:VHQIONBY:Sharon Paulino RilyWolters, M.D. PROCEDURE DATE:  08/18/2015 PROCEDURE:   Colonoscopy, screening First Screening Colonoscopy - Avg.  risk and is 50 yrs.  old or older Yes.  Prior Negative Screening - Now for repeat screening. N/A  History of Adenoma - Now for follow-up colonoscopy & has been > or = to 3 yrs.  N/A  Polyps removed today? No Recommend repeat exam, <10 yrs? No ASA CLASS:   Class II INDICATIONS:Screening for colonic neoplasia and Colorectal Neoplasm Risk Assessment for this procedure is average risk. MEDICATIONS: Monitored anesthesia care and Propofol 350 mg IV  DESCRIPTION OF PROCEDURE:   After the risks benefits and alternatives of the procedure were thoroughly explained, informed consent was obtained.  The digital rectal exam revealed no rectal mass.   The LB 1528  endoscope was introduced through the anus and advanced to the terminal ileum which was intubated for a short distance. No adverse events experienced.   The quality of the prep was good.  (Suprep was used)  The instrument was then slowly withdrawn as the colon was fully examined. Estimated blood loss is zero unless otherwise noted in this procedure report.   COLON FINDINGS: The examined terminal ileum appeared to be normal. A normal appearing cecum, ileocecal valve, and appendiceal orifice were identified.  The ascending, transverse, descending, sigmoid colon, and rectum appeared unremarkable.  Retroflexed views revealed internal hemorrhoids. The time to cecum = 6.4 Withdrawal time = 7.6   The scope was withdrawn and the procedure completed.  COMPLICATIONS: There were no immediate complications.  ENDOSCOPIC IMPRESSION: 1.   The examined terminal ileum appeared to be normal 2.    Normal colonoscopy  RECOMMENDATIONS: You should continue to follow colorectal cancer screening guidelines for "routine risk" patients with a repeat colonoscopy in 10 years. There is no need for FOBT (stool) testing for at least 5 years.  eSigned:  Beverley FiedlerJay M Pyrtle, MD 08/18/2015 2:45 PM   cc: The Patient and Mila PalmerSharon Wolters, MD

## 2015-08-21 ENCOUNTER — Telehealth: Payer: Self-pay | Admitting: *Deleted

## 2015-08-21 NOTE — Telephone Encounter (Signed)
  Follow up Call-  Call back number 08/18/2015  Post procedure Call Back phone  # [phone]  Permission to leave phone message Yes     Patient questions:  Do you have a fever, pain , or abdominal swelling? No. Pain Score  0 *  Have you tolerated food without any problems? Yes.    Have you been able to return to your normal activities? Yes.    Do you have any questions about your discharge instructions: Diet   No. Medications  No. Follow up visit  No.  Do you have questions or concerns about your Care? No.  Actions: * If pain score is 4 or above: No action needed, pain <4.

## 2016-04-22 ENCOUNTER — Other Ambulatory Visit: Payer: Self-pay | Admitting: Gynecology

## 2016-04-22 DIAGNOSIS — Z1231 Encounter for screening mammogram for malignant neoplasm of breast: Secondary | ICD-10-CM

## 2016-05-17 ENCOUNTER — Ambulatory Visit
Admission: RE | Admit: 2016-05-17 | Discharge: 2016-05-17 | Disposition: A | Discharge status: Home / Self Care | Payer: BLUE CROSS/BLUE SHIELD | Source: Ambulatory Visit | Attending: Gynecology | Admitting: Gynecology

## 2016-05-17 DIAGNOSIS — Z1231 Encounter for screening mammogram for malignant neoplasm of breast: Secondary | ICD-10-CM

## 2017-03-05 ENCOUNTER — Encounter: Payer: Self-pay | Admitting: Gynecology

## 2017-09-05 ENCOUNTER — Other Ambulatory Visit: Payer: Self-pay | Admitting: Gynecology

## 2017-09-05 DIAGNOSIS — Z1231 Encounter for screening mammogram for malignant neoplasm of breast: Secondary | ICD-10-CM

## 2017-10-06 ENCOUNTER — Ambulatory Visit: Payer: BLUE CROSS/BLUE SHIELD

## 2017-12-11 ENCOUNTER — Other Ambulatory Visit: Payer: Self-pay

## 2017-12-11 ENCOUNTER — Encounter (HOSPITAL_COMMUNITY): Payer: Self-pay | Admitting: Emergency Medicine

## 2017-12-11 ENCOUNTER — Emergency Department (HOSPITAL_COMMUNITY): Payer: BLUE CROSS/BLUE SHIELD

## 2017-12-11 DIAGNOSIS — R509 Fever, unspecified: Secondary | ICD-10-CM | POA: Diagnosis present

## 2017-12-11 DIAGNOSIS — M7918 Myalgia, other site: Secondary | ICD-10-CM | POA: Insufficient documentation

## 2017-12-11 DIAGNOSIS — Z79899 Other long term (current) drug therapy: Secondary | ICD-10-CM | POA: Insufficient documentation

## 2017-12-11 DIAGNOSIS — Z7982 Long term (current) use of aspirin: Secondary | ICD-10-CM | POA: Insufficient documentation

## 2017-12-11 DIAGNOSIS — B349 Viral infection, unspecified: Secondary | ICD-10-CM | POA: Diagnosis not present

## 2017-12-11 LAB — COMPREHENSIVE METABOLIC PANEL
ALK PHOS: 56 U/L (ref 38–126)
ALT: 18 U/L (ref 14–54)
AST: 20 U/L (ref 15–41)
Albumin: 3.8 g/dL (ref 3.5–5.0)
Anion gap: 8 (ref 5–15)
BUN: 21 mg/dL — ABNORMAL HIGH (ref 6–20)
CALCIUM: 9.3 mg/dL (ref 8.9–10.3)
CO2: 24 mmol/L (ref 22–32)
CREATININE: 0.62 mg/dL (ref 0.44–1.00)
Chloride: 102 mmol/L (ref 101–111)
GFR calc non Af Amer: >60 mL/min (ref >60)
GLUCOSE: 99 mg/dL (ref 65–99)
Potassium: 3.9 mmol/L (ref 3.5–5.1)
SODIUM: 134 mmol/L — AB (ref 135–145)
Total Bilirubin: 0.6 mg/dL (ref 0.3–1.2)
Total Protein: 6.6 g/dL (ref 6.5–8.1)

## 2017-12-11 LAB — I-STAT BETA HCG BLOOD, ED (MC, WL, AP ONLY): I-stat hCG, quantitative: 5.2 m[IU]/mL — ABNORMAL HIGH (ref <5)

## 2017-12-11 LAB — I-STAT CG4 LACTIC ACID, ED: Lactic Acid, Venous: 0.43 mmol/L — ABNORMAL LOW (ref 0.5–1.9)

## 2017-12-11 LAB — CBC WITH DIFFERENTIAL/PLATELET
BASOS PCT: 1 %
Basophils Absolute: 0.1 10*3/uL (ref 0.0–0.1)
EOS ABS: 0.4 10*3/uL (ref 0.0–0.7)
Eosinophils Relative: 5 %
HCT: 36.2 % (ref 36.0–46.0)
Hemoglobin: 12.2 g/dL (ref 12.0–15.0)
Lymphocytes Relative: 26 %
Lymphs Abs: 2 10*3/uL (ref 0.7–4.0)
MCH: 31.6 pg (ref 26.0–34.0)
MCHC: 33.7 g/dL (ref 30.0–36.0)
MCV: 93.8 fL (ref 78.0–100.0)
MONO ABS: 0.6 10*3/uL (ref 0.1–1.0)
MONOS PCT: 8 %
NEUTROS PCT: 60 %
Neutro Abs: 4.7 10*3/uL (ref 1.7–7.7)
PLATELETS: 298 10*3/uL (ref 150–400)
RBC: 3.86 MIL/uL — ABNORMAL LOW (ref 3.87–5.11)
RDW: 11.7 % (ref 11.5–15.5)
WBC: 7.7 10*3/uL (ref 4.0–10.5)

## 2017-12-11 NOTE — ED Triage Notes (Signed)
Pt complaint of flu like symptoms with headache, body aches, and chills onset last week; negative pneumonia and flu at pcp. Pt denies neck stiffness. No fever at present time.

## 2017-12-12 ENCOUNTER — Emergency Department (HOSPITAL_COMMUNITY)
Admission: EM | Admit: 2017-12-12 | Discharge: 2017-12-12 | Disposition: A | Discharge status: Home / Self Care | Payer: BLUE CROSS/BLUE SHIELD | Attending: Emergency Medicine | Admitting: Emergency Medicine

## 2017-12-12 DIAGNOSIS — B349 Viral infection, unspecified: Secondary | ICD-10-CM

## 2017-12-12 LAB — URINALYSIS, ROUTINE W REFLEX MICROSCOPIC
Bilirubin Urine: NEGATIVE
GLUCOSE, UA: NEGATIVE mg/dL
KETONES UR: NEGATIVE mg/dL
NITRITE: NEGATIVE
PROTEIN: NEGATIVE mg/dL
Specific Gravity, Urine: 1.014 (ref 1.005–1.030)
pH: 5 (ref 5.0–8.0)

## 2017-12-12 LAB — I-STAT CG4 LACTIC ACID, ED: Lactic Acid, Venous: 0.63 mmol/L (ref 0.5–1.9)

## 2017-12-12 MED ORDER — SODIUM CHLORIDE 0.9 % IV BOLUS (SEPSIS)
1000.0000 mL | Freq: Once | INTRAVENOUS | Status: AC
  Administered 2017-12-12: 1000 mL via INTRAVENOUS

## 2017-12-12 MED ORDER — KETOROLAC TROMETHAMINE 15 MG/ML IJ SOLN
15.0000 mg | Freq: Once | INTRAMUSCULAR | Status: AC
  Administered 2017-12-12: 15 mg via INTRAVENOUS
  Filled 2017-12-12: qty 1

## 2017-12-12 MED ORDER — METOCLOPRAMIDE HCL 5 MG/ML IJ SOLN
10.0000 mg | Freq: Once | INTRAMUSCULAR | Status: AC
  Administered 2017-12-12: 10 mg via INTRAVENOUS
  Filled 2017-12-12: qty 2

## 2017-12-12 NOTE — ED Provider Notes (Signed)
[facility]EMER[facility]ENCY DEPT Provider Note: Lowella Dell, MD, FACEP  CSN: [id number] MRN: [id number] ARRIVAL: 12/11/17 at 1741 ROOM: WA11/WA11   CHIEF COMPLAINT  Flu Like Symptoms   HISTORY OF PRESENT ILLNESS  12/12/17 2:12 AM [name] is a 57 y.o. female who has had what she thought were flulike symptoms for the past 10 days.  Specifically she has had a fever, body aches and chills.  Her fever went as high as 102.9.  She was started on Tamiflu by her PCP early in the course and she has completed it.  She treated her fever and body aches with ibuprofen.  Over the past several days she has had a worsening of symptoms with return of fever and body aches.  She is also had headaches which tend to occur sporadically and last a few minutes at a time.  She also had blurred vision and photophobia yesterday.  She has not had a stiff neck or neck pain.  She has not had a sore throat, nasal congestion, cough or shortness of breath.  She did have nausea yesterday but had not previously had GI symptoms.   Past Medical History:  Diagnosis Date  . Arthritis   . Multinodular goiter   . Osteopenia 04/2015   T score -1.3 FRAX 5.2%/0.3%    Past Surgical History:  Procedure Laterality Date  . CESAREAN SECTION    . CHOLECYSTECTOMY      Family History  Problem Relation Age of Onset  . Diabetes Brother   . Hypertension Brother   . Colon polyps Brother   . Cancer Paternal Uncle        colon  . Colon cancer Paternal Uncle 43  . Cancer Brother        kidney  . Diabetes Mother   . Hypertension Mother   . Heart disease Mother   . Cancer Father        kidney  . Diabetes Sister   . Hypertension Sister   . Heart disease Maternal Aunt   . Heart disease Maternal Uncle   . Diabetes Maternal Grandmother   . Heart disease Maternal Grandfather     Social History   Tobacco Use  . Smoking status: Never Smoker  . Smokeless tobacco: Never Used  Substance Use Topics  . Alcohol use: Yes   Alcohol/week: 0.0 oz    Comment: occasional glass of wine  . Drug use: No    Prior to Admission medications   Medication Sig Start Date End Date Taking? Authorizing Provider  acetaminophen (TYLENOL) 500 MG tablet Take 1,000 mg by mouth every 6 (six) hours as needed for moderate pain.   Yes [provider]  aspirin 81 MG chewable tablet Chew 81 mg by mouth daily.   Yes [provider]  calcium carbonate (OSCAL) 1500 (600 Ca) MG TABS tablet Take 600 mg of elemental calcium by mouth daily.   Yes [provider]  Cholecalciferol (VITAMIN D) 2000 units CAPS Take 2,000 Units by mouth daily.   Yes [provider]  doxylamine, Sleep, (UNISOM) 25 MG tablet Take 25-37.5 mg by mouth at bedtime as needed for sleep.   Yes [provider]  fish oil-omega-3 fatty acids 1000 MG capsule Take 2 g by mouth daily.   Yes [provider]  ibuprofen (ADVIL,MOTRIN) 200 MG tablet Take 800 mg by mouth every 6 (six) hours as needed for moderate pain.   Yes [provider]  MAGNESIUM PO Take 1 tablet by mouth  daily.    Yes [provider]  Multiple Vitamin (MULTIVITAMIN) tablet Take 1 tablet by mouth daily.   Yes [provider]  TURMERIC PO Take 1 tablet by mouth daily.   Yes [provider]    Allergies Patient has no known allergies.   REVIEW OF SYSTEMS  Negative except as noted here or in the History of Present Illness.   PHYSICAL EXAMINATION  Initial Vital Signs Blood pressure 140/89, pulse 74, temperature 98 F (36.7 C), temperature source Oral, resp. rate 18, SpO2 99 %.  Examination General: Well-developed, well-nourished female in no acute distress; appearance consistent with age of record HENT: normocephalic; atraumatic Eyes: pupils equal, round and reactive to light; extraocular muscles intact Neck: supple Heart: regular rate and rhythm; no murmurs, rubs or gallops Lungs: clear to auscultation  bilaterally Abdomen: soft; nondistended; nontender; no masses or hepatosplenomegaly; bowel sounds present Extremities: No deformity; full range of motion; pulses normal Neurologic: Awake, alert and oriented; motor function intact in all extremities and symmetric; no facial droop Skin: Warm and dry Psychiatric: Normal mood and affect   RESULTS  Summary of this visit's results, reviewed by myself:   EKG Interpretation  Date/Time:    Ventricular Rate:    PR Interval:    QRS Duration:   QT Interval:    QTC Calculation:   R Axis:     Text Interpretation:        Laboratory Studies: Results for orders placed or performed during the hospital encounter of 12/12/17 (from the past 24 hour(s))  Comprehensive metabolic panel     Status: Abnormal   Collection Time: 12/11/17  6:38 PM  Result Value Ref Range   Sodium 134 (L) 135 - 145 mmol/L   Potassium 3.9 3.5 - 5.1 mmol/L   Chloride 102 101 - 111 mmol/L   CO2 24 22 - 32 mmol/L   Glucose, Bld 99 65 - 99 mg/dL   BUN 21 (H) 6 - 20 mg/dL   Creatinine, Ser 1.610.62 0.44 - 1.00 mg/dL   Calcium 9.3 8.9 - 09.610.3 mg/dL   Total Protein 6.6 6.5 - 8.1 g/dL   Albumin 3.8 3.5 - 5.0 g/dL   AST 20 15 - 41 U/L   ALT 18 14 - 54 U/L   Alkaline Phosphatase 56 38 - 126 U/L   Total Bilirubin 0.6 0.3 - 1.2 mg/dL   GFR calc non Af Amer >60 >60 mL/min   GFR calc Af Amer >60 >60 mL/min   Anion gap 8 5 - 15  CBC with Differential     Status: Abnormal   Collection Time: 12/11/17  6:38 PM  Result Value Ref Range   WBC 7.7 4.0 - 10.5 K/uL   RBC 3.86 (L) 3.87 - 5.11 MIL/uL   Hemoglobin 12.2 12.0 - 15.0 g/dL   HCT 04.536.2 40.936.0 - 81.146.0 %   MCV 93.8 78.0 - 100.0 fL   MCH 31.6 26.0 - 34.0 pg   MCHC 33.7 30.0 - 36.0 g/dL   RDW 91.411.7 78.211.5 - 95.615.5 %   Platelets 298 150 - 400 K/uL   Neutrophils Relative % 60 %   Neutro Abs 4.7 1.7 - 7.7 K/uL   Lymphocytes Relative 26 %   Lymphs Abs 2.0 0.7 - 4.0 K/uL   Monocytes Relative 8 %   Monocytes Absolute 0.6 0.1 - 1.0 K/uL    Eosinophils Relative 5 %   Eosinophils Absolute 0.4 0.0 - 0.7 K/uL   Basophils Relative 1 %  Basophils Absolute 0.1 0.0 - 0.1 K/uL  I-Stat beta hCG blood, ED     Status: Abnormal   Collection Time: 12/11/17  6:51 PM  Result Value Ref Range   I-stat hCG, quantitative 5.2 (H) <5 mIU/mL   Comment 3          I-Stat CG4 Lactic Acid, ED     Status: Abnormal   Collection Time: 12/11/17  6:53 PM  Result Value Ref Range   Lactic Acid, Venous 0.43 (L) 0.5 - 1.9 mmol/L  Urinalysis, Routine w reflex microscopic     Status: Abnormal   Collection Time: 12/12/17  2:15 AM  Result Value Ref Range   Color, Urine YELLOW YELLOW   APPearance CLEAR CLEAR   Specific Gravity, Urine 1.014 1.005 - 1.030   pH 5.0 5.0 - 8.0   Glucose, UA NEGATIVE NEGATIVE mg/dL   Hgb urine dipstick SMALL (A) NEGATIVE   Bilirubin Urine NEGATIVE NEGATIVE   Ketones, ur NEGATIVE NEGATIVE mg/dL   Protein, ur NEGATIVE NEGATIVE mg/dL   Nitrite NEGATIVE NEGATIVE   Leukocytes, UA TRACE (A) NEGATIVE   RBC / HPF 0-5 0 - 5 RBC/hpf   WBC, UA 0-5 0 - 5 WBC/hpf   Bacteria, UA RARE (A) NONE SEEN   Squamous Epithelial / LPF 0-5 (A) NONE SEEN   Mucus PRESENT   I-Stat CG4 Lactic Acid, ED     Status: None   Collection Time: 12/12/17  2:48 AM  Result Value Ref Range   Lactic Acid, Venous 0.63 0.5 - 1.9 mmol/L   Imaging Studies: Dg Chest 2 View  Result Date: 12/11/2017 CLINICAL DATA:  Flu like symptoms with body ache and chills. EXAM: CHEST  2 VIEW COMPARISON:  09/24/2005 FINDINGS: The heart size and mediastinal contours are within normal limits. Both lungs are clear. The visualized skeletal structures are unremarkable. IMPRESSION: No active cardiopulmonary disease. Electronically Signed   By: Tollie Eth M.D.   On: 12/11/2017 19:16    ED COURSE  Nursing notes and initial vitals signs, including pulse oximetry, reviewed.  Vitals:   12/11/17 1807 12/11/17 2338 12/12/17 0241 12/12/17 0415  BP: (!) 122/96 140/89 116/83 116/77  Pulse:  76 74 80 81  Resp: 18 18 18 18   Temp: 98.5 F (36.9 C) 98 F (36.7 C)    TempSrc: Oral Oral    SpO2: 98% 99% 98% 98%   6:12 AM Patient feeling significantly better after IV fluid bolus and medications.  She states she is ready to go home.  The patient's history is consistent with a viral illness although her symptoms were not typical for influenza.  Her diagnostic studies were reassuring.  PROCEDURES    ED DIAGNOSES     ICD-10-CM   1. Viral illness B34.9        Thi Sisemore, MD 12/12/17 [phone]

## 2017-12-19 ENCOUNTER — Ambulatory Visit: Payer: BLUE CROSS/BLUE SHIELD

## 2018-01-02 ENCOUNTER — Ambulatory Visit
Admission: RE | Admit: 2018-01-02 | Discharge: 2018-01-02 | Disposition: A | Discharge status: Home / Self Care | Payer: BLUE CROSS/BLUE SHIELD | Source: Ambulatory Visit | Attending: Gynecology | Admitting: Gynecology

## 2018-01-02 DIAGNOSIS — Z1231 Encounter for screening mammogram for malignant neoplasm of breast: Secondary | ICD-10-CM

## 2018-05-18 ENCOUNTER — Other Ambulatory Visit: Payer: Self-pay | Admitting: Ophthalmology

## 2018-12-30 ENCOUNTER — Other Ambulatory Visit: Payer: Self-pay | Admitting: Family Medicine

## 2018-12-30 DIAGNOSIS — Z1231 Encounter for screening mammogram for malignant neoplasm of breast: Secondary | ICD-10-CM

## 2019-01-29 ENCOUNTER — Ambulatory Visit: Payer: BLUE CROSS/BLUE SHIELD

## 2019-02-05 IMAGING — CR DG CHEST 2V
2 series · 2 of 2 positions shown · non-contrast
Comparison: 09/24/2005

CLINICAL DATA: Flu like symptoms with body ache and chills.

EXAM:
CHEST  2 VIEW

[w chest pa]
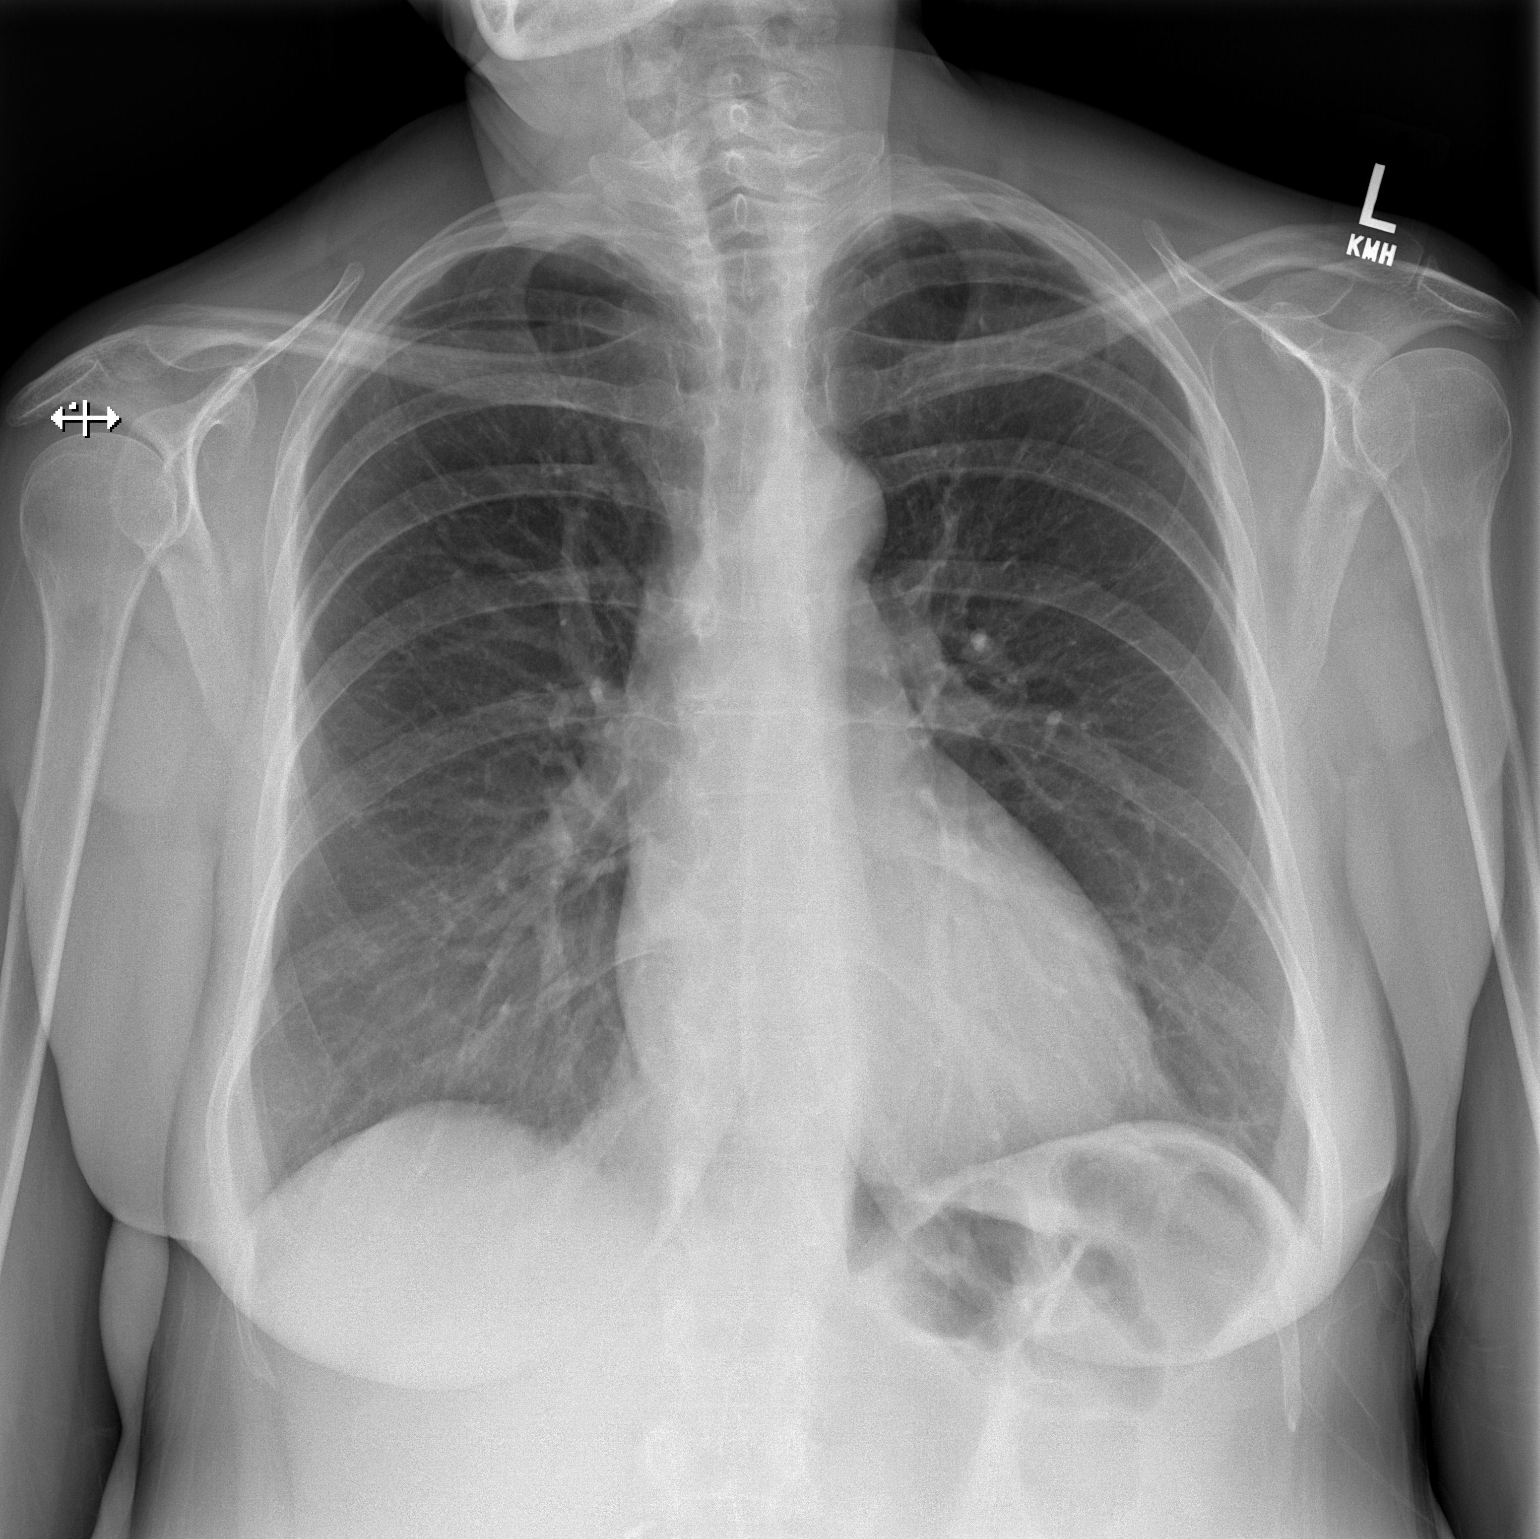

[w chest lat]
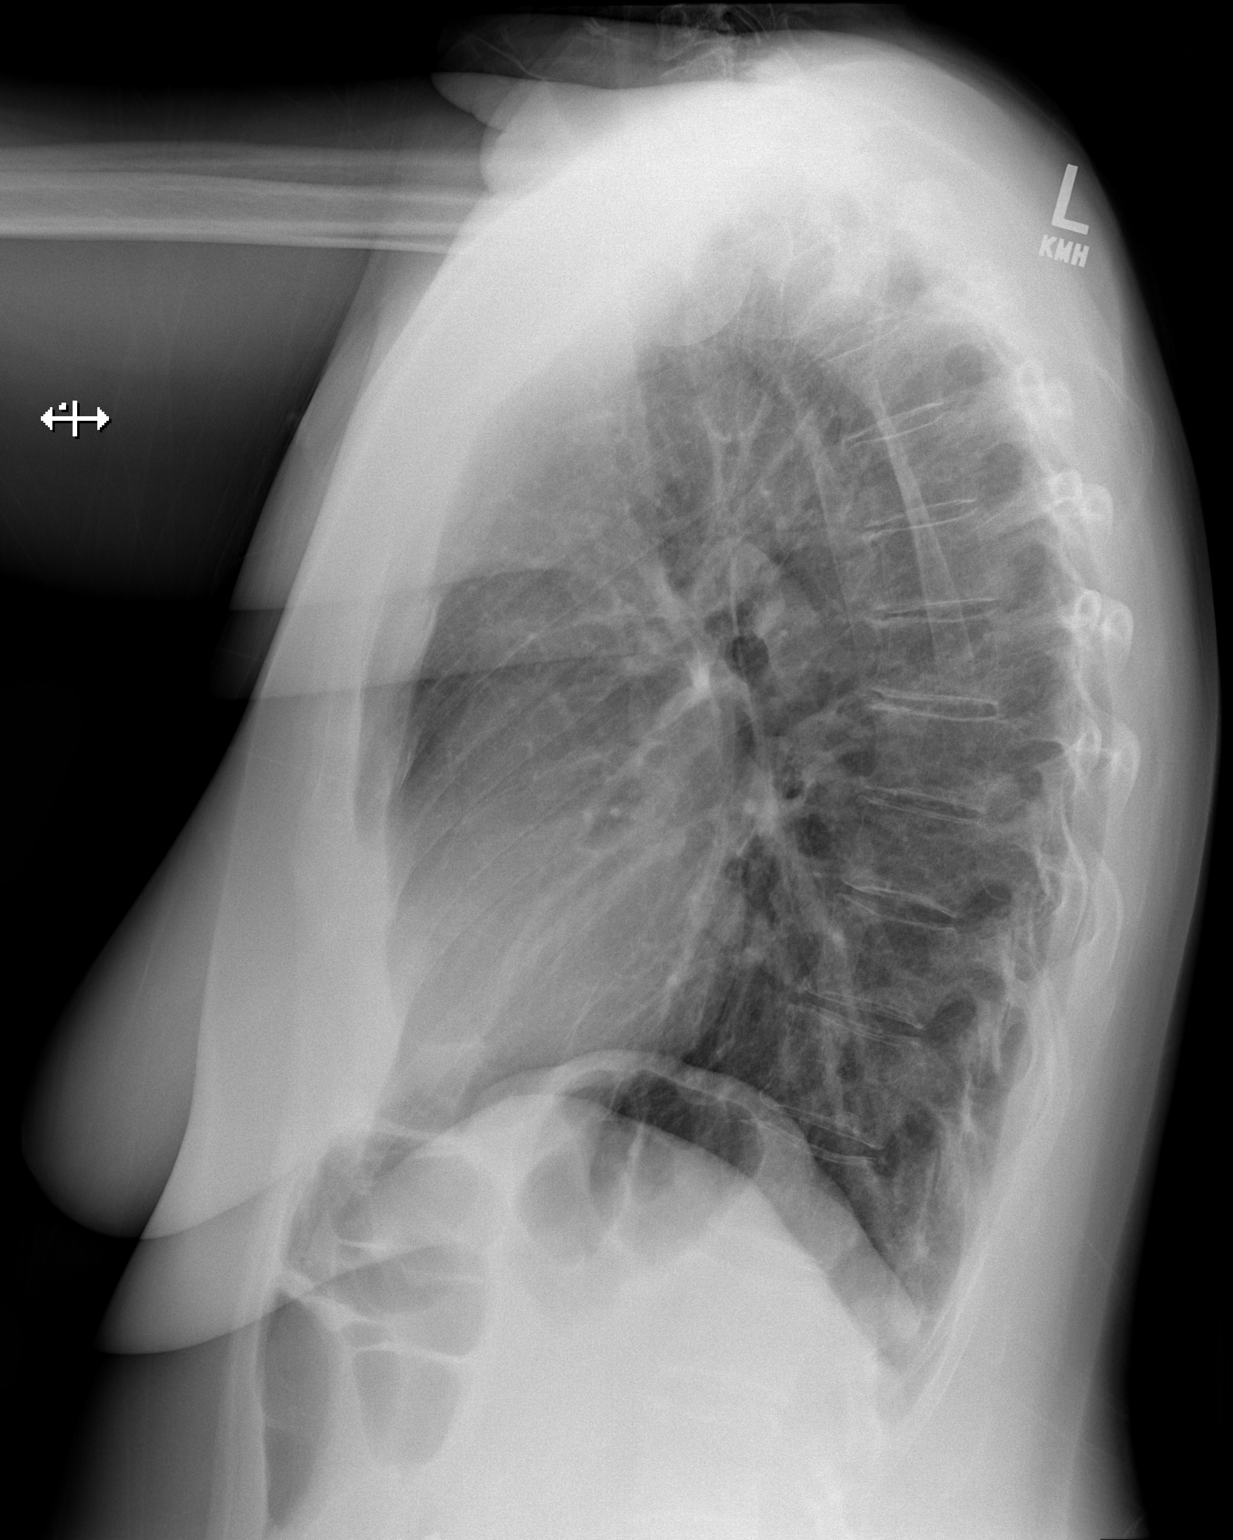

[2 of 2 positions shown; findings below may reference images not displayed]

FINDINGS: The heart size and mediastinal contours are within normal limits.
Both lungs are clear. The visualized skeletal structures are
unremarkable.
IMPRESSION: No active cardiopulmonary disease.

## 2019-03-26 ENCOUNTER — Ambulatory Visit: Payer: BLUE CROSS/BLUE SHIELD

## 2019-05-07 ENCOUNTER — Other Ambulatory Visit: Payer: Self-pay

## 2019-05-07 ENCOUNTER — Ambulatory Visit
Admission: RE | Admit: 2019-05-07 | Discharge: 2019-05-07 | Disposition: A | Discharge status: Home / Self Care | Payer: BLUE CROSS/BLUE SHIELD | Source: Ambulatory Visit | Attending: Family Medicine | Admitting: Family Medicine

## 2019-05-07 DIAGNOSIS — Z1231 Encounter for screening mammogram for malignant neoplasm of breast: Secondary | ICD-10-CM

## 2019-07-20 ENCOUNTER — Encounter: Payer: Self-pay | Admitting: Gynecology

## 2019-08-27 ENCOUNTER — Ambulatory Visit: Payer: Self-pay | Admitting: Gynecology

## 2019-10-01 ENCOUNTER — Ambulatory Visit: Payer: Self-pay | Admitting: Gynecology

## 2020-11-24 ENCOUNTER — Other Ambulatory Visit: Payer: Self-pay | Admitting: Family Medicine

## 2020-11-24 DIAGNOSIS — Z1231 Encounter for screening mammogram for malignant neoplasm of breast: Secondary | ICD-10-CM

## 2020-12-29 ENCOUNTER — Ambulatory Visit: Payer: No Typology Code available for payment source

## 2021-01-26 ENCOUNTER — Ambulatory Visit: Payer: No Typology Code available for payment source | Admitting: Obstetrics & Gynecology

## 2021-01-26 ENCOUNTER — Encounter: Payer: Self-pay | Admitting: Obstetrics & Gynecology

## 2021-01-26 ENCOUNTER — Other Ambulatory Visit: Payer: Self-pay

## 2021-01-26 ENCOUNTER — Other Ambulatory Visit (HOSPITAL_COMMUNITY)
Admission: RE | Admit: 2021-01-26 | Discharge: 2021-01-26 | Disposition: A | Discharge status: Home / Self Care | Payer: No Typology Code available for payment source | Source: Ambulatory Visit | Attending: Obstetrics & Gynecology | Admitting: Obstetrics & Gynecology

## 2021-01-26 VITALS — BP 126/82 | Ht 65.0 in | Wt 195.0 lb

## 2021-01-26 DIAGNOSIS — Z01419 Encounter for gynecological examination (general) (routine) without abnormal findings: Secondary | ICD-10-CM

## 2021-01-26 DIAGNOSIS — E6609 Other obesity due to excess calories: Secondary | ICD-10-CM | POA: Diagnosis not present

## 2021-01-26 DIAGNOSIS — Z780 Asymptomatic menopausal state: Secondary | ICD-10-CM

## 2021-01-26 DIAGNOSIS — M85852 Other specified disorders of bone density and structure, left thigh: Secondary | ICD-10-CM | POA: Diagnosis not present

## 2021-01-26 DIAGNOSIS — Z6832 Body mass index (BMI) 32.0-32.9, adult: Secondary | ICD-10-CM

## 2021-01-26 NOTE — Progress Notes (Signed)
[name] 10-01-1961 [id number]   History:    60 y.o. G3P1A2L1  Married.  Daughter is 21 yo.  RP: New (>3 yrs) patient presenting for annual gyn exam   HPI: Postmenopause x >13 yrs on no HRT.  No PMB.  No pelvic pain.  C/O vaginal dryness.  Breasts normal.  Urine/BMs normal.  BMI 32.45.  Health labs with Fam MD.  Past medical history,surgical history, family history and social history were all reviewed and documented in the EPIC chart.  Gynecologic History No LMP recorded. Patient is postmenopausal.  Obstetric History OB History  Gravida Para Term Preterm AB Living  3 1       1   SAB IAB Ectopic Multiple Live Births               # Outcome Date GA Lbr Len/2nd Weight Sex Delivery Anes PTL Lv  3 Para           2 Gravida           1 Gravida              ROS: A ROS was performed and pertinent positives and negatives are included in the history. GENERAL: No fevers or chills. HEENT: No change in vision, no earache, sore throat or sinus congestion. NECK: No pain or stiffness. CARDIOVASCULAR: No chest pain or pressure. No palpitations. PULMONARY: No shortness of breath, cough or wheeze. GASTROINTESTINAL: No abdominal pain, nausea, vomiting or diarrhea, melena or bright red blood per rectum. GENITOURINARY: No urinary frequency, urgency, hesitancy or dysuria. MUSCULOSKELETAL: No joint or muscle pain, no back pain, no recent trauma. DERMATOLOGIC: No rash, no itching, no lesions. ENDOCRINE: No polyuria, polydipsia, no heat or cold intolerance. No recent change in weight. HEMATOLOGICAL: No anemia or easy bruising or bleeding. NEUROLOGIC: No headache, seizures, numbness, tingling or weakness. PSYCHIATRIC: No depression, no loss of interest in normal activity or change in sleep pattern.     Exam:   BP 126/82   Ht 5\' 5"  (1.651 m)   Wt 195 lb (88.5 kg)   BMI 32.45 kg/m   Body mass index is 32.45 kg/m.  General appearance : Well developed well nourished female. No acute  distress HEENT: Eyes: no retinal hemorrhage or exudates,  Neck supple, trachea midline, no carotid bruits, no thyroidmegaly Lungs: Clear to auscultation, no rhonchi or wheezes, or rib retractions  Heart: Regular rate and rhythm, no murmurs or gallops Breast:Examined in sitting and supine position were symmetrical in appearance, no palpable masses or tenderness,  no skin retraction, no nipple inversion, no nipple discharge, no skin discoloration, no axillary or supraclavicular lymphadenopathy Abdomen: no palpable masses or tenderness, no rebound or guarding Extremities: no edema or skin discoloration or tenderness  Pelvic: Vulva: Normal             Vagina: No gross lesions or discharge  Cervix: No gross lesions or discharge.  Pap reflex done.  Uterus  AV, normal size, shape and consistency, non-tender and mobile  Adnexa  Without masses or tenderness  Anus: Normal   Assessment/Plan:  60 y.o. female for annual exam   1. Encounter for routine gynecological examination with Papanicolaou smear of cervix Gynecologic exam in menopause.  Pap reflex done.  Breasts normal.  Schedule screening mammo.  [facility] 2016.  Health labs with fam MD. - Cytology - PAP( [facility])  2. Postmenopause Well on no HRT.  No PMB.  3. Osteopenia of neck of left femur Last BD in  2016.  Repeat BD now.  Vit D, Ca++ 1.5 g/d total, regular weight lifting                                                                      - DG Bone Density; Future  4. Class 1 obesity due to excess calories without serious comorbidity with body mass index (BMI) of 32.0 to 32.9 in adult Recommend a lower Calorie/Carb diet.  Aerobics 5 x per week.  Light weight lifting every 2 days.  Other orders - UNABLE TO FIND; CBD OIL- TAKES AT NIGHT - Zinc Sulfate (ZINC 15 PO); Take by mouth.  Genia Del MD, 10:51 AM 01/26/2021

## 2021-01-28 ENCOUNTER — Encounter: Payer: Self-pay | Admitting: Obstetrics & Gynecology

## 2021-01-29 LAB — CYTOLOGY - PAP: Diagnosis: NEGATIVE

## 2021-03-09 ENCOUNTER — Ambulatory Visit
Admission: RE | Admit: 2021-03-09 | Discharge: 2021-03-09 | Disposition: A | Discharge status: Home / Self Care | Payer: No Typology Code available for payment source | Source: Ambulatory Visit | Attending: Family Medicine | Admitting: Family Medicine

## 2021-03-09 ENCOUNTER — Other Ambulatory Visit: Payer: Self-pay

## 2021-03-09 DIAGNOSIS — Z1231 Encounter for screening mammogram for malignant neoplasm of breast: Secondary | ICD-10-CM

## 2021-03-20 ENCOUNTER — Other Ambulatory Visit: Payer: Self-pay | Admitting: Obstetrics & Gynecology

## 2021-03-20 ENCOUNTER — Ambulatory Visit (INDEPENDENT_AMBULATORY_CARE_PROVIDER_SITE_OTHER): Payer: No Typology Code available for payment source

## 2021-03-20 ENCOUNTER — Other Ambulatory Visit: Payer: Self-pay

## 2021-03-20 DIAGNOSIS — M8589 Other specified disorders of bone density and structure, multiple sites: Secondary | ICD-10-CM

## 2021-03-20 DIAGNOSIS — Z780 Asymptomatic menopausal state: Secondary | ICD-10-CM

## 2021-03-20 DIAGNOSIS — M85852 Other specified disorders of bone density and structure, left thigh: Secondary | ICD-10-CM

## 2022-02-01 ENCOUNTER — Ambulatory Visit: Payer: No Typology Code available for payment source | Admitting: Obstetrics & Gynecology

## 2022-05-24 ENCOUNTER — Ambulatory Visit: Payer: No Typology Code available for payment source | Admitting: Obstetrics & Gynecology

## 2022-11-14 ENCOUNTER — Other Ambulatory Visit: Payer: Self-pay | Admitting: Family Medicine

## 2022-11-14 DIAGNOSIS — Z1231 Encounter for screening mammogram for malignant neoplasm of breast: Secondary | ICD-10-CM

## 2022-11-15 ENCOUNTER — Ambulatory Visit
Admission: RE | Admit: 2022-11-15 | Discharge: 2022-11-15 | Disposition: A | Discharge status: Home / Self Care | Payer: Managed Care, Other (non HMO) | Source: Ambulatory Visit | Attending: Family Medicine | Admitting: Family Medicine

## 2022-11-15 DIAGNOSIS — Z1231 Encounter for screening mammogram for malignant neoplasm of breast: Secondary | ICD-10-CM

## 2023-04-30 NOTE — Progress Notes (Signed)
Katrina Else, MD Reason for referral-chest pain  HPI: 62 year old female for evaluation of chest pain at request of Mila Palmer, MD.  Patient seen in May with complaints of chest pressure by report.  Also with palpitations.  Troponin, BNP and D-dimer normal.  Creatinine 0.53.  Hemoglobin normal.  Also with normal TSH.  Patient states that in May she had an episode of palpitations described as "a fish flopping" in her chest.  She has had occasional palpitations since then not as severe.  She also had an episode in June of 3 days of chest pressure that was intermittent.  It was substernal and occasionally radiated to her neck.  No associated symptoms.  Would resolve after 20 minutes but then would return.  Since then she has not had significant dyspnea on exertion, orthopnea, PND.  She does not have exertional chest pain and no syncope.  Current Outpatient Medications  Medication Sig Dispense Refill   aspirin 81 MG chewable tablet Chew 81 mg by mouth daily.     calcium carbonate (OSCAL) 1500 (600 Ca) MG TABS tablet Take 1,000 mg of elemental calcium by mouth daily.     Cholecalciferol (VITAMIN D) 125 MCG (5000 UT) CAPS Take 125 mcg by mouth daily.     doxylamine, Sleep, (UNISOM) 25 MG tablet Take 25-37.5 mg by mouth at bedtime as needed for sleep.     fish oil-omega-3 fatty acids 1000 MG capsule Take 2,250 mg by mouth daily.     folic acid (FOLVITE) 800 MCG tablet Take 800 mcg by mouth daily.     ibuprofen (ADVIL,MOTRIN) 200 MG tablet Take 800 mg by mouth every 6 (six) hours as needed for moderate pain.     MAGNESIUM PO Take 350 mg by mouth daily.     Melatonin 10 MG TABS Take 10 mg by mouth.     Multiple Vitamin (MULTIVITAMIN) tablet Take 1 tablet by mouth daily.     UNABLE TO FIND CBD OIL- TAKES AT NIGHT     vitamin B-12 (CYANOCOBALAMIN) 500 MCG tablet Take 500 mcg by mouth daily.     Zinc Sulfate (ZINC 15 PO) Take 50 mg by mouth.     No current facility-administered  medications for this visit.    No Known Allergies   Past Medical History:  Diagnosis Date   Arthritis    Hyperlipidemia    IBS (irritable bowel syndrome)    Multinodular goiter    Osteopenia 04/21/2015   T score -1.3 FRAX 5.2%/0.3%    Past Surgical History:  Procedure Laterality Date   CESAREAN SECTION     CHOLECYSTECTOMY      Social History   Socioeconomic History   Marital status: Married    Spouse name: Not on file   Number of children: 1   Years of education: Not on file   Highest education level: Not on file  Occupational History   Not on file  Tobacco Use   Smoking status: Former    Types: Cigarettes   Smokeless tobacco: Never  Vaping Use   Vaping status: Never Used  Substance and Sexual Activity   Alcohol use: Yes    Alcohol/week: 0.0 standard drinks of alcohol    Comment: occasional glass of wine   Drug use: No   Sexual activity: Yes    Partners: Male    Birth control/protection: Post-menopausal    Comment: intercourse age 6, PARTNERS- 31, married- 30 yrs  Other Topics Concern   Not on file  Social History Narrative   Not on file   Social Determinants of Health   Financial Resource Strain: Not on file  Food Insecurity: Not on file  Transportation Needs: Not on file  Physical Activity: Not on file  Stress: Not on file  Social Connections: Not on file  Intimate Partner Violence: Not on file    Family History  Problem Relation Age of Onset   Diabetes Mother    Hypertension Mother    Heart disease Mother    Cancer Father        kidney   Diabetes Sister    Hypertension Sister    Diabetes Brother    Hypertension Brother    Colon polyps Brother    Cancer Brother        kidney   Diabetes Maternal Grandmother    Heart disease Maternal Grandfather    Heart disease Maternal Aunt    Heart disease Maternal Uncle    Cancer Paternal Uncle        colon   Colon cancer Paternal Uncle 70    ROS: no fevers or chills, productive cough,  hemoptysis, dysphasia, odynophagia, melena, hematochezia, dysuria, hematuria, rash, seizure activity, orthopnea, PND, pedal edema, claudication. Remaining systems are negative.  Physical Exam:   Blood pressure 124/84, pulse 71, height 5\' 6"  (1.676 m), weight 190 lb 9.6 oz (86.5 kg), SpO2 98%.  General:  Well developed/well nourished in NAD Skin warm/dry Patient not depressed No peripheral clubbing Back-normal HEENT-normal/normal eyelids Neck supple/normal carotid upstroke bilaterally; no bruits; no JVD; no thyromegaly chest - CTA/ normal expansion CV - RRR/normal S1 and S2; no murmurs, rubs or gallops;  PMI nondisplaced Abdomen -NT/ND, no HSM, no mass, + bowel sounds, no bruit 2+ femoral pulses, no bruits Ext-no edema, chords, 2+ DP Neuro-grossly nonfocal  EKG Interpretation Date/Time:  Monday May 12 2023 15:08:16 EDT Ventricular Rate:  71 PR Interval:  178 QRS Duration:  98 QT Interval:  412 QTC Calculation: 447 R Axis:   -7  Text Interpretation: Normal sinus rhythm Possible Left atrial enlargement Nonspecific T wave abnormality When compared with ECG of 20-Nov-2007 10:14, No significant change was found Confirmed by Olga Millers ([id number]) on 05/12/2023 3:08:58 PM    A/P  1 chest pressure-symptoms are atypical.  Will arrange cardiac CTA to rule out obstructive coronary disease.  2 palpitations-she did record some rhythm strips with her Apple Watch that showed sinus rhythm.  She did record some rhythm strips with her Apple Watch that showed sinus rhythm.  She will continue to forward any strips associated with her palpitations.  Can consider beta-blocker in the future if necessary.  Check echocardiogram for LV function.  Olga Millers, MD

## 2023-05-12 ENCOUNTER — Encounter: Payer: Self-pay | Admitting: Cardiology

## 2023-05-12 ENCOUNTER — Ambulatory Visit: Payer: Managed Care, Other (non HMO) | Attending: Cardiology | Admitting: Cardiology

## 2023-05-12 VITALS — BP 124/84 | HR 71 | Ht 66.0 in | Wt 190.6 lb

## 2023-05-12 DIAGNOSIS — R0789 Other chest pain: Secondary | ICD-10-CM

## 2023-05-12 DIAGNOSIS — R002 Palpitations: Secondary | ICD-10-CM | POA: Diagnosis not present

## 2023-05-12 MED ORDER — METOPROLOL TARTRATE 100 MG PO TABS: ORAL_TABLET | ORAL | 0 refills | Status: AC

## 2023-05-12 NOTE — Patient Instructions (Signed)
Testing/Procedures:  Your physician has requested that you have an echocardiogram. Echocardiography is a painless test that uses sound waves to create images of your heart. It provides your doctor with information about the size and shape of your heart and how well your heart's chambers and valves are working. This procedure takes approximately one hour. There are no restrictions for this procedure. Please do NOT wear cologne, perfume, aftershave, or lotions (deodorant is allowed). Please arrive 15 minutes prior to your appointment time. [address]     Your cardiac CT will be scheduled at   [facility] [address] [place], [place] [address] [phone]   If scheduled at [facility], please arrive at the [facility] 2 and Children's Entrance (Entrance C2) of [facility] 30 minutes prior to test start time. You can use the FREE valet parking offered at entrance C (encouraged to control the heart rate for the test)  Proceed to the [facility] Radiology Department (first floor) to check-in and test prep.  All radiology patients and guests should use entrance C2 at [facility], accessed from [facility], even though the hospital's physical address listed is [address].     Please follow these instructions carefully (unless otherwise directed):  An IV will be required for this test and Nitroglycerin will be given.    On the Night Before the Test: Be sure to Drink plenty of water. Do not consume any caffeinated/decaffeinated beverages or chocolate 12 hours prior to your test. Do not take any antihistamines 12 hours prior to your test.   On the Day of the Test: Drink plenty of water until 1 hour prior to the test. Do not eat any food 1 hour prior to test. You may take your regular medications prior to the test.  Take metoprolol (Lopressor) 100 MG two hours prior to test. FEMALES- please wear  underwire-free bra if available, avoid dresses & tight clothing      After the Test: Drink plenty of water. After receiving IV contrast, you may experience a mild flushed feeling. This is normal. On occasion, you may experience a mild rash up to 24 hours after the test. This is not dangerous. If this occurs, you can take Benadryl 25 mg and increase your fluid intake. If you experience trouble breathing, this can be serious. If it is severe call 911 IMMEDIATELY. If it is mild, please call our office.   We will call to schedule your test 2-4 weeks out understanding that some insurance companies will need an authorization prior to the service being performed.   For more information and frequently asked questions, please visit our website : [email or web address]  For non-scheduling related questions, please contact the cardiac imaging nurse navigator should you have any questions/concerns: Cardiac Imaging Nurse Navigators Direct Office Dial: [phone]   For scheduling needs, including cancellations and rescheduling, please call [place], [phone].    Follow-Up: At [facility], you and your health needs are our priority.  As part of our continuing mission to provide you with exceptional heart care, we have created designated Provider Care Teams.  These Care Teams include your primary Cardiologist (physician) and Advanced Practice Providers (APPs -  Physician Assistants and Nurse Practitioners) who all work together to provide you with the care you need, when you need it.  We recommend signing up for the patient portal called "MyChart".  Sign up information is provided on this After Visit Summary.  MyChart is used to connect with patients for Virtual Visits (Telemedicine).  Patients are able to view lab/test results, encounter notes, upcoming appointments, etc.  Non-urgent messages can be sent to your provider as well.   To learn more about what you can do with MyChart,  go to [email or web address].    Your next appointment:   6 month(s)  Provider:   Olga Millers MD

## 2023-05-15 ENCOUNTER — Encounter (HOSPITAL_COMMUNITY): Payer: Self-pay

## 2023-05-16 ENCOUNTER — Telehealth (HOSPITAL_COMMUNITY): Payer: Self-pay | Admitting: *Deleted

## 2023-05-16 NOTE — Telephone Encounter (Signed)
Patient calling about her upcoming cardiac imaging study; pt verbalizes understanding of appt date/time, parking situation and where to check in, pre-test NPO status and medications ordered, and verified current allergies; name and call back number provided for further questions should they arise  Katrina Brick RN Navigator Cardiac Imaging [name] [phone] office [phone] cell  Patient to take 100mg  metoprolol tartrate two hours prior to her cardiac CT scan.  She is aware to arrive at 7:30 AM.

## 2023-05-20 ENCOUNTER — Ambulatory Visit (HOSPITAL_COMMUNITY)
Admission: RE | Admit: 2023-05-20 | Discharge: 2023-05-20 | Disposition: A | Discharge status: Home / Self Care | Payer: Managed Care, Other (non HMO) | Source: Ambulatory Visit | Attending: Cardiology | Admitting: Cardiology

## 2023-05-20 DIAGNOSIS — R072 Precordial pain: Secondary | ICD-10-CM | POA: Diagnosis not present

## 2023-05-20 DIAGNOSIS — R0789 Other chest pain: Secondary | ICD-10-CM | POA: Insufficient documentation

## 2023-05-20 MED ORDER — IOHEXOL 350 MG/ML SOLN
95.0000 mL | Freq: Once | INTRAVENOUS | Status: AC | PRN
  Administered 2023-05-20: 95 mL via INTRAVENOUS

## 2023-05-20 MED ORDER — NITROGLYCERIN 0.4 MG SL SUBL
0.8000 mg | SUBLINGUAL_TABLET | SUBLINGUAL | Status: DC | PRN
  Administered 2023-05-20: 0.8 mg via SUBLINGUAL

## 2023-05-20 MED ORDER — NITROGLYCERIN 0.4 MG SL SUBL
SUBLINGUAL_TABLET | SUBLINGUAL | Status: AC
  Filled 2023-05-20: qty 2

## 2023-06-03 ENCOUNTER — Other Ambulatory Visit (HOSPITAL_COMMUNITY): Payer: Managed Care, Other (non HMO)

## 2023-06-09 ENCOUNTER — Ambulatory Visit (HOSPITAL_COMMUNITY): Payer: Managed Care, Other (non HMO) | Attending: Cardiology

## 2023-06-09 DIAGNOSIS — R072 Precordial pain: Secondary | ICD-10-CM

## 2023-06-09 DIAGNOSIS — R002 Palpitations: Secondary | ICD-10-CM | POA: Insufficient documentation

## 2023-06-09 LAB — ECHOCARDIOGRAM COMPLETE
Area-P 1/2: 3.2 cm2
S' Lateral: 3.1 cm

## 2023-06-13 ENCOUNTER — Telehealth: Payer: Self-pay | Admitting: Cardiology

## 2023-06-13 NOTE — Telephone Encounter (Signed)
Spoke with pt, okay to restart supplements.

## 2023-06-13 NOTE — Telephone Encounter (Signed)
Returned pt call and advised pt this message will be forwarded to the  nurse Debra.

## 2023-06-13 NOTE — Telephone Encounter (Signed)
Pt stated she'd like a callback from nurse Stanton Kidney regarding if it's ok for her to start her supplements again since she was advised to not take them until after results were back. Please advise

## 2023-10-03 ENCOUNTER — Other Ambulatory Visit: Payer: Self-pay | Admitting: Nurse Practitioner

## 2023-10-03 ENCOUNTER — Ambulatory Visit
Admission: RE | Admit: 2023-10-03 | Discharge: 2023-10-03 | Disposition: A | Discharge status: Home / Self Care | Payer: Managed Care, Other (non HMO) | Source: Ambulatory Visit | Attending: Nurse Practitioner | Admitting: Nurse Practitioner

## 2023-10-03 ENCOUNTER — Encounter: Payer: Self-pay | Admitting: Nurse Practitioner

## 2023-10-03 DIAGNOSIS — E042 Nontoxic multinodular goiter: Secondary | ICD-10-CM

## 2023-10-07 ENCOUNTER — Other Ambulatory Visit: Payer: Self-pay | Admitting: Nurse Practitioner

## 2023-10-07 DIAGNOSIS — E042 Nontoxic multinodular goiter: Secondary | ICD-10-CM

## 2023-11-12 ENCOUNTER — Ambulatory Visit
Admission: RE | Admit: 2023-11-12 | Discharge: 2023-11-12 | Disposition: A | Discharge status: Home / Self Care | Payer: Managed Care, Other (non HMO) | Source: Ambulatory Visit | Attending: Nurse Practitioner | Admitting: Nurse Practitioner

## 2023-11-12 ENCOUNTER — Other Ambulatory Visit: Payer: Self-pay | Admitting: Nurse Practitioner

## 2023-11-12 DIAGNOSIS — E042 Nontoxic multinodular goiter: Secondary | ICD-10-CM

## 2023-12-01 NOTE — Progress Notes (Deleted)
 HPI: FU chest pain.  Patient seen 5/24 with complaints of chest pressure by report.  Also with palpitations.  Troponin, BNP and D-dimer normal.  Creatinine 0.53.  Hemoglobin normal.  Also with normal TSH.  Coronary CTA July 2024 showed calcium score 0, total plaque volume 0 and no coronary disease.  Echocardiogram August 2024 showed normal LV function, mild left ventricular hypertrophy.  Since last seen  Current Outpatient Medications  Medication Sig Dispense Refill   aspirin 81 MG chewable tablet Chew 81 mg by mouth daily.     calcium carbonate (OSCAL) 1500 (600 Ca) MG TABS tablet Take 1,000 mg of elemental calcium by mouth daily.     Cholecalciferol (VITAMIN D) 125 MCG (5000 UT) CAPS Take 125 mcg by mouth daily.     doxylamine, Sleep, (UNISOM) 25 MG tablet Take 25-37.5 mg by mouth at bedtime as needed for sleep.     fish oil-omega-3 fatty acids 1000 MG capsule Take 2,250 mg by mouth daily.     folic acid (FOLVITE) 800 MCG tablet Take 800 mcg by mouth daily.     ibuprofen (ADVIL,MOTRIN) 200 MG tablet Take 800 mg by mouth every 6 (six) hours as needed for moderate pain.     MAGNESIUM PO Take 350 mg by mouth daily.     Melatonin 10 MG TABS Take 10 mg by mouth.     metoprolol tartrate (LOPRESSOR) 100 MG tablet TAKE 2 HOURS PRIOR TO CT SCAN 1 tablet 0   Multiple Vitamin (MULTIVITAMIN) tablet Take 1 tablet by mouth daily.     UNABLE TO FIND CBD OIL- TAKES AT NIGHT     vitamin B-12 (CYANOCOBALAMIN) 500 MCG tablet Take 500 mcg by mouth daily.     Zinc Sulfate (ZINC 15 PO) Take 50 mg by mouth.     No current facility-administered medications for this visit.     Past Medical History:  Diagnosis Date   Arthritis    Hyperlipidemia    IBS (irritable bowel syndrome)    Multinodular goiter    Osteopenia 04/21/2015   T score -1.3 FRAX 5.2%/0.3%    Past Surgical History:  Procedure Laterality Date   CESAREAN SECTION     CHOLECYSTECTOMY      Social History   Socioeconomic History    Marital status: Married    Spouse name: Not on file   Number of children: 1   Years of education: Not on file   Highest education level: Not on file  Occupational History   Not on file  Tobacco Use   Smoking status: Former    Types: Cigarettes   Smokeless tobacco: Never  Vaping Use   Vaping status: Never Used  Substance and Sexual Activity   Alcohol use: Yes    Alcohol/week: 0.0 standard drinks of alcohol    Comment: occasional glass of wine   Drug use: No   Sexual activity: Yes    Partners: Male    Birth control/protection: Post-menopausal    Comment: intercourse age 48, 30- 82, married- 30 yrs  Other Topics Concern   Not on file  Social History Narrative   Not on file   Social Drivers of Health   Financial Resource Strain: Not on file  Food Insecurity: Not on file  Transportation Needs: Not on file  Physical Activity: Not on file  Stress: Not on file  Social Connections: Not on file  Intimate Partner Violence: Not on file    Family History  Problem Relation Age  of Onset   Diabetes Mother    Hypertension Mother    Heart disease Mother    Cancer Father        kidney   Diabetes Sister    Hypertension Sister    Diabetes Brother    Hypertension Brother    Colon polyps Brother    Cancer Brother        kidney   Diabetes Maternal Grandmother    Heart disease Maternal Grandfather    Heart disease Maternal Aunt    Heart disease Maternal Uncle    Cancer Paternal Uncle        colon   Colon cancer Paternal Uncle 70    ROS: no fevers or chills, productive cough, hemoptysis, dysphasia, odynophagia, melena, hematochezia, dysuria, hematuria, rash, seizure activity, orthopnea, PND, pedal edema, claudication. Remaining systems are negative.  Physical Exam: Well-developed well-nourished in no acute distress.  Skin is warm and dry.  HEENT is normal.  Neck is supple.  Chest is clear to auscultation with normal expansion.  Cardiovascular exam is regular rate and  rhythm.  Abdominal exam nontender or distended. No masses palpated. Extremities show no edema. neuro grossly intact  ECG- personally reviewed  A/P  1 history of chest pain-follow-up CTA showed no coronary disease and calcium score 0.  She has had no further symptoms.  2 palpitations-patient will forward any strips associated with her symptoms from her Apple watch.  Note LV function is normal.  Olga Millers, MD

## 2023-12-11 ENCOUNTER — Ambulatory Visit: Payer: Managed Care, Other (non HMO) | Admitting: Cardiology

## 2024-03-11 NOTE — Progress Notes (Signed)
 HPI: FU chest pain.  Coronary CTA July 2024 showed calcium score 0 and no coronary disease.  Echocardiogram August 2024 showed normal LV function, mild left ventricular hypertrophy and borderline dilatation of ascending aorta at 39 mm.  Since last seen she denies dyspnea, chest pain or syncope.  Occasional brief flutters that are rare but no sustained palpitations.  Current Outpatient Medications  Medication Sig Dispense Refill   aspirin 81 MG chewable tablet Chew 81 mg by mouth daily.     calcium carbonate (OSCAL) 1500 (600 Ca) MG TABS tablet Take 1,000 mg of elemental calcium by mouth daily.     Cholecalciferol (VITAMIN D) 125 MCG (5000 UT) CAPS Take 125 mcg by mouth daily.     doxylamine, Sleep, (UNISOM) 25 MG tablet Take 25-37.5 mg by mouth at bedtime as needed for sleep.     fish oil-omega-3 fatty acids 1000 MG capsule Take 2,250 mg by mouth daily.     folic acid (FOLVITE) 800 MCG tablet Take 800 mcg by mouth daily.     ibuprofen (ADVIL,MOTRIN) 200 MG tablet Take 800 mg by mouth every 6 (six) hours as needed for moderate pain.     MAGNESIUM PO Take 350 mg by mouth daily.     Melatonin 10 MG TABS Take 10 mg by mouth.     metoprolol  tartrate (LOPRESSOR ) 100 MG tablet TAKE 2 HOURS PRIOR TO CT SCAN 1 tablet 0   Multiple Vitamin (MULTIVITAMIN) tablet Take 1 tablet by mouth daily.     UNABLE TO FIND CBD OIL- TAKES AT NIGHT     vitamin B-12 (CYANOCOBALAMIN) 500 MCG tablet Take 500 mcg by mouth daily.     Zinc Sulfate (ZINC 15 PO) Take 50 mg by mouth.     No current facility-administered medications for this visit.     Past Medical History:  Diagnosis Date   Arthritis    Hyperlipidemia    IBS (irritable bowel syndrome)    Multinodular goiter    Osteopenia 04/21/2015   T score -1.3 FRAX 5.2%/0.3%    Past Surgical History:  Procedure Laterality Date   CESAREAN SECTION     CHOLECYSTECTOMY      Social History   Socioeconomic History   Marital status: Married    Spouse  name: Not on file   Number of children: 1   Years of education: Not on file   Highest education level: Not on file  Occupational History   Not on file  Tobacco Use   Smoking status: Former    Types: Cigarettes   Smokeless tobacco: Never  Vaping Use   Vaping status: Never Used  Substance and Sexual Activity   Alcohol use: Yes    Alcohol/week: 0.0 standard drinks of alcohol    Comment: occasional glass of wine   Drug use: No   Sexual activity: Yes    Partners: Male    Birth control/protection: Post-menopausal    Comment: intercourse age 64, 21- 89, married- 30 yrs  Other Topics Concern   Not on file  Social History Narrative   Not on file   Social Drivers of Health   Financial Resource Strain: Not on file  Food Insecurity: Not on file  Transportation Needs: Not on file  Physical Activity: Not on file  Stress: Not on file  Social Connections: Not on file  Intimate Partner Violence: Not on file    Family History  Problem Relation Age of Onset   Diabetes Mother  Hypertension Mother    Heart disease Mother    Cancer Father        kidney   Diabetes Sister    Hypertension Sister    Diabetes Brother    Hypertension Brother    Colon polyps Brother    Cancer Brother        kidney   Diabetes Maternal Grandmother    Heart disease Maternal Grandfather    Heart disease Maternal Aunt    Heart disease Maternal Uncle    Cancer Paternal Uncle        colon   Colon cancer Paternal Uncle 70    ROS: no fevers or chills, productive cough, hemoptysis, dysphasia, odynophagia, melena, hematochezia, dysuria, hematuria, rash, seizure activity, orthopnea, PND, pedal edema, claudication. Remaining systems are negative.  Physical Exam: Well-developed well-nourished in no acute distress.  Skin is warm and dry.  HEENT is normal.  Neck is supple.  Chest is clear to auscultation with normal expansion.  Cardiovascular exam is regular rate and rhythm.  Abdominal exam nontender  or distended. No masses palpated. Extremities show no edema. neuro grossly intact  EKG Interpretation Date/Time:  Thursday March 25 2024 08:12:32 EDT Ventricular Rate:  86 PR Interval:  154 QRS Duration:  88 QT Interval:  364 QTC Calculation: 435 R Axis:   7  Text Interpretation: Normal sinus rhythm Non-specific ST-t changes Confirmed by Alexandria Angel ([id number]) on 03/25/2024 8:14:16 AM  ECG unchanged compared to 05/12/23  A/P  1 chest pain-no recurrent symptoms.  Previous CTA showed no coronary disease and LV function normal on echocardiogram.  Note we will discontinue aspirin as she has known coronary artery disease.  2 palpitations-occasional brief flutters but no sustained palpitations.  Symptoms have improved compared to previous.  Can consider monitor in the future if needed.  Alexandria Angel, MD

## 2024-03-24 ENCOUNTER — Other Ambulatory Visit: Payer: Self-pay | Admitting: Family Medicine

## 2024-03-24 DIAGNOSIS — Z1231 Encounter for screening mammogram for malignant neoplasm of breast: Secondary | ICD-10-CM

## 2024-03-25 ENCOUNTER — Ambulatory Visit: Payer: Managed Care, Other (non HMO) | Attending: Cardiology | Admitting: Cardiology

## 2024-03-25 ENCOUNTER — Encounter: Payer: Self-pay | Admitting: Cardiology

## 2024-03-25 VITALS — BP 118/82 | HR 86 | Ht 67.0 in | Wt 191.0 lb

## 2024-03-25 DIAGNOSIS — R002 Palpitations: Secondary | ICD-10-CM

## 2024-03-25 DIAGNOSIS — R0789 Other chest pain: Secondary | ICD-10-CM | POA: Diagnosis not present

## 2024-03-25 NOTE — Patient Instructions (Signed)
 Medication Instructions:  Continue same medications *If you need a refill on your cardiac medications before your next appointment, please call your pharmacy*  Lab Work: None ordered  Testing/Procedures: None ordered  Follow-Up: At [facility], you and your health needs are our priority.  As part of our continuing mission to provide you with exceptional heart care, our providers are all part of one team.  This team includes your primary Cardiologist (physician) and Advanced Practice Providers or APPs (Physician Assistants and Nurse Practitioners) who all work together to provide you with the care you need, when you need it.  Your next appointment:  1 year   Call in March to schedule June appointment     Provider:  Lilian Register   We recommend signing up for the patient portal called "MyChart".  Sign up information is provided on this After Visit Summary.  MyChart is used to connect with patients for Virtual Visits (Telemedicine).  Patients are able to view lab/test results, encounter notes, upcoming appointments, etc.  Non-urgent messages can be sent to your provider as well.   To learn more about what you can do with MyChart, go to [email or web address].

## 2024-03-31 ENCOUNTER — Other Ambulatory Visit: Payer: Self-pay | Admitting: Nurse Practitioner

## 2024-03-31 DIAGNOSIS — E042 Nontoxic multinodular goiter: Secondary | ICD-10-CM

## 2024-04-02 ENCOUNTER — Ambulatory Visit
Admission: RE | Admit: 2024-04-02 | Discharge: 2024-04-02 | Disposition: A | Discharge status: Home / Self Care | Source: Ambulatory Visit | Attending: Family Medicine | Admitting: Family Medicine

## 2024-04-02 DIAGNOSIS — Z1231 Encounter for screening mammogram for malignant neoplasm of breast: Secondary | ICD-10-CM

## 2024-04-30 ENCOUNTER — Other Ambulatory Visit

## 2024-04-30 ENCOUNTER — Ambulatory Visit
Admission: RE | Admit: 2024-04-30 | Discharge: 2024-04-30 | Disposition: A | Discharge status: Home / Self Care | Source: Ambulatory Visit | Attending: Nurse Practitioner | Admitting: Nurse Practitioner

## 2024-04-30 DIAGNOSIS — E042 Nontoxic multinodular goiter: Secondary | ICD-10-CM
